# Patient Record
Sex: Female | Born: 1985
Health system: Southern US, Community
[De-identification: ages and names within clinical notes are randomized; demographics above are authoritative.]

## PROBLEM LIST (undated history)

## (undated) DIAGNOSIS — Z789 Other specified health status: Secondary | ICD-10-CM

## (undated) HISTORY — PX: FOOT TENDON SURGERY: SHX958

---

## 2014-01-03 LAB — EKG 12-LEAD
Atrial Rate: 58 {beats}/min
P Axis: 65 degrees
P-R Interval: 170 ms
Q-T Interval: 430 ms
QRS Duration: 98 ms
QTc Calculation (Bazett): 422 ms
R Axis: 70 degrees
T Axis: 29 degrees
Ventricular Rate: 58 {beats}/min

## 2014-01-25 NOTE — Progress Notes (Signed)
YEARLY PHYSICAL    Date of service: 01/25/2014    Kelli CalkinsLaura Wallace  Is a 28 y.o.  married female    PT's PCP is: No primary provider on file.     DOB: [birth year]                                             Subjective:       Patient's last menstrual period was 01/04/2014.     Are your menses regular: yes    OB History   No data available        History   Smoking status   ??? Never Smoker    Smokeless tobacco   ??? Never Used        History   Alcohol Use   ??? 0.0 oz/week   ??? 0 Not specified per week     Comment: very rarely       History reviewed. No pertinent family history.    Allergies: Pcn    Current outpatient prescriptions:PROAIR HFA 108 (90 BASE) MCG/ACT inhaler, , Disp: , Rfl: 1    History   Sexual Activity   ??? Sexual Activity:   ??? Partners: Male       Any bleeding or pain with intercourse: No    Last Yearly:  2013    Last pap: 2013    Last HPV: never    Last Mammogram: never    Last Dexascan never    Do you do self breast exams: Yes    No past medical history on file.    No past surgical history on file.    History reviewed. No pertinent family history.    Chief Complaint   Patient presents with   ??? Gynecologic Exam     yearly wellness exam          PE:  Vital Signs  Blood pressure 102/64, pulse 62, resp. rate 18, height 5\' 10"  (1.778 m), weight 166 lb (75.297 kg), last menstrual period 01/04/2014, not currently breastfeeding.     Labs: Declines Hgb had recent labs drawn    No results found for this visit on 01/25/14.      NURSE: Kelli A Ammanniti LPN    HPI: i am doing well.    Yes PT denies fever, chills, nausea and vomiting       Objective  Lymphatic:   no lymphadenopathy  Heent:   negative   Cor: regular rate and rhythm, no murmurs              ZOX:WRUEAPul:clear to auscultation bilaterally- no wheezes, rales or rhonchi, normal air movement, no respiratory distress      GI: Abdomen soft, non-tender. BS normal. No masses,  No organomegaly            Extremities: normal strength, tone, and muscle mass   Breasts: Breast:normal appearance, no masses or tenderness   Pelvic Exam: GENITAL/URINARY:  External Genitalia:  General appearance; normal, Hair distribution; normal, Lesions absent  Urethra:  Fullness absent, Masses absent  Bladder:  Fullness absent, Masses absent, Tenderness absent, Cystocele absent  Vagina:  General appearance normal, Estrogen effect normal, Discharge absent, Lesions absent, Pelvic support normal  Cervix:  General appearance normal, Lesions absent, Discharge absent, Tenderness absent, Enlargement absent, Nodularity absent  Uterus:  Size normal, Contour normal  Adenexa:  Masses absent, Tenderness absent  Vaginal discharge: no vaginal discharge     Over 50% of time spent on counseling and care coordination on: see assessment and plan   She was also counseled on her preventative health maintenance recommendations and follow-up.                        Assessment and Plan         1. Encounter for gynecological examination without abnormal finding  PAP SMEAR    Chlamydia/GC DNA, Thin Prep    POCT Urinalysis no Micro   2. Screen for STD (sexually transmitted disease)  Chlamydia/GC DNA, Thin Prep       I am having [name] maintain her PROAIR HFA.

## 2014-01-27 LAB — C.TRACHOMATIS N.GONORRHOEAE DNA, THIN PREP
Chlamydia By Thin Prep: NEGATIVE
N. gonorrhoeae DNA, Thin Prep: NEGATIVE

## 2014-02-08 LAB — GYN CYTOLOGY

## 2014-02-09 NOTE — Progress Notes (Signed)
Quick Note:    Note sent for normal pap  ______

## 2015-07-29 ENCOUNTER — Emergency Department: Payer: Worker's Compensation

## 2015-07-29 ENCOUNTER — Emergency Department
Admission: EM | Admit: 2015-07-29 | Discharge: 2015-07-29 | Disposition: A | Discharge status: Home / Self Care | Payer: Worker's Compensation | Attending: Emergency Medicine | Admitting: Emergency Medicine

## 2015-07-29 DIAGNOSIS — Y9389 Activity, other specified: Secondary | ICD-10-CM | POA: Diagnosis not present

## 2015-07-29 DIAGNOSIS — Y999 Unspecified external cause status: Secondary | ICD-10-CM | POA: Insufficient documentation

## 2015-07-29 DIAGNOSIS — Y929 Unspecified place or not applicable: Secondary | ICD-10-CM | POA: Insufficient documentation

## 2015-07-29 DIAGNOSIS — G8911 Acute pain due to trauma: Secondary | ICD-10-CM

## 2015-07-29 DIAGNOSIS — S0181XA Laceration without foreign body of other part of head, initial encounter: Secondary | ICD-10-CM | POA: Insufficient documentation

## 2015-07-29 DIAGNOSIS — S0083XA Contusion of other part of head, initial encounter: Secondary | ICD-10-CM

## 2015-07-29 DIAGNOSIS — W208XXA Other cause of strike by thrown, projected or falling object, initial encounter: Secondary | ICD-10-CM | POA: Insufficient documentation

## 2015-07-29 MED ORDER — CEPHALEXIN 500 MG PO CAPS: 500.0000 mg | ORAL_CAPSULE | Freq: Three times a day (TID) | ORAL | Status: DC

## 2015-07-29 MED ORDER — LIDOCAINE HCL (PF) 1 % IJ SOLN
5.0000 mL | Freq: Once | INTRAMUSCULAR | Status: DC
  Filled 2015-07-29: qty 5

## 2015-07-29 NOTE — ED Notes (Signed)
Laceration approx 1 inch under left eye. PT states that this occurred this AM from a "hammer" getting stuck in net while practicing shotput. Pt denies vision changes. Pt alert and oriented X4, active, cooperative, pt in NAD. RR even and unlabored, color WNL.  No bleeding at this time.

## 2015-07-29 NOTE — Discharge Instructions (Signed)
Cryotherapy Cryotherapy is when you put ice on your injury. Ice helps lessen pain and puffiness (swelling) after an injury. Ice works the best when you start using it in the first 24 to 48 hours after an injury. HOME CARE  Put a dry or damp towel between the ice pack and your skin.  You may press gently on the ice pack.  Leave the ice on for no more than 10 to 20 minutes at a time.  Check your skin after 5 minutes to make sure your skin is okay.  Rest at least 20 minutes between ice pack uses.  Stop using ice when your skin loses feeling (numbness).  Do not use ice on someone who cannot tell you when it hurts. This includes small children and people with memory problems (dementia). GET HELP RIGHT AWAY IF:  You have white spots on your skin.  Your skin turns blue or pale.  Your skin feels waxy or hard.  Your puffiness gets worse. MAKE SURE YOU:   Understand these instructions.  Will watch your condition.  Will get help right away if you are not doing well or get worse.   This information is not intended to replace advice given to you by your health care provider. Make sure you discuss any questions you have with your health care provider.   Document Released: 09/11/2007 Document Revised: 06/17/2011 Document Reviewed: 11/15/2010 Elsevier Interactive Patient Education 2016 [employer]  Facial Laceration A facial laceration is a cut on the face. These injuries can be painful and cause bleeding. Some cuts may need to be closed with stitches (sutures), skin adhesive strips, or wound glue. Cuts usually heal quickly but can leave a scar. It can take 1-2 years for the scar to go away completely. HOME CARE   Only take medicines as told by your doctor.  Follow your doctor's instructions for wound care. For Stitches:  Keep the cut clean and dry.  If you have a bandage (dressing), change it at least once a day. Change the bandage if it gets wet or dirty, or as told by your  doctor.  Wash the cut with soap and water 2 times a day. Rinse the cut with water. Pat it dry with a clean towel.  Put a thin layer of medicated cream on the cut as told by your doctor.  You may shower after the first 24 hours. Do not soak the cut in water until the stitches are removed.  Have your stitches removed as told by your doctor.  Do not wear any makeup until a few days after your stitches are removed. For Skin Adhesive Strips:  Keep the cut clean and dry.  Do not get the strips wet. You may take a bath, but be careful to keep the cut dry.  If the cut gets wet, pat it dry with a clean towel.  The strips will fall off on their own. Do not remove the strips that are still stuck to the cut. For Wound Glue:  You may shower or take baths. Do not soak or scrub the cut. Do not swim. Avoid heavy sweating until the glue falls off on its own. After a shower or bath, pat the cut dry with a clean towel.  Do not put medicine or makeup on your cut until the glue falls off.  If you have a bandage, do not put tape over the glue.  Avoid lots of sunlight or tanning lamps until the glue falls off.  The glue will fall off on its own in 5-10 days. Do not pick at the glue. After Healing:  Put sunscreen on the cut for the first year to reduce your scar. GET HELP IF:  You have a fever. GET HELP RIGHT AWAY IF:   Your cut area gets red, painful, or puffy (swollen).  You see a yellowish-white fluid (pus) coming from the cut.   This information is not intended to replace advice given to you by your health care provider. Make sure you discuss any questions you have with your health care provider.   Document Released: 09/11/2007 Document Revised: 04/15/2014 Document Reviewed: 11/05/2012 Elsevier Interactive Patient Education 2016 Elsevier Inc.  Facial or Scalp Contusion  A facial or scalp contusion is a deep bruise on the face or head. Contusions happen when an injury causes bleeding  under the skin. Signs of bruising include pain, puffiness (swelling), and discolored skin. The contusion may turn blue, purple, or yellow. HOME CARE  Only take medicines as told by your doctor.  Put ice on the injured area.  Put ice in a plastic bag.  Place a towel between your skin and the bag.  Leave the ice on for 20 minutes, 2-3 times a day. GET HELP IF:  You have bite problems.  You have pain when chewing.  You are worried about your face not healing normally. GET HELP RIGHT AWAY IF:   You have severe pain or a headache and medicine does not help.  You are very tired or confused, or your personality changes.  You throw up (vomit).  You have a nosebleed that will not stop.  You see two of everything (double vision) or have blurry vision.  You have fluid coming from your nose or ear.  You have problems walking or using your arms or legs. MAKE SURE YOU:   Understand these instructions.  Will watch your condition.  Will get help right away if you are not doing well or get worse.   This information is not intended to replace advice given to you by your health care provider. Make sure you discuss any questions you have with your health care provider.   Document Released: 03/14/2011 Document Revised: 04/15/2014 Document Reviewed: 11/05/2012 Elsevier Interactive Patient Education [employer]

## 2015-07-29 NOTE — ED Provider Notes (Signed)
[facility] Emergency Department Provider Note  ____________________________________________  Time seen: Approximately 11:19 AM  I have reviewed the triage vital signs and the nursing notes.   HISTORY  Chief Complaint Facial Laceration    HPI Joy Lawson is a 30 y.o. female , NAD, presents to the emergency department with complaint of laceration under the left eye that occurred today. States she is a [employer] and was getting a stuck shotput hammer released from noting that was 10 feet in the air. Was on a ladder at the time. States she was able to release the hammer but unfortunately it fell and struck her below the left eye. She did not fall from the ladder and denies any dizziness, headache, visual changes. Notes that she was able to and from the ladder without assistance to tend to the laceration. She is accompanied by her husband who states the patient has been walking and talking per her usual. Has not noted any changes in her demeanor. Tetanus is up-to-date. Has not taken anything for pain and denies any pain at this time.   History reviewed. No pertinent past medical history.  There are no active problems to display for this patient.   History reviewed. No pertinent past surgical history.  Current Outpatient Rx  Name  Route  Sig  Dispense  Refill  . cephALEXin (KEFLEX) 500 MG capsule   Oral   Take 1 capsule (500 mg total) by mouth 3 (three) times daily.   21 capsule   0     Allergies Review of patient's allergies indicates no known allergies.  No family history on file.  Social History Social History  Substance Use Topics  . Smoking status: Never Smoker   . Smokeless tobacco: None  . Alcohol Use: No     Review of Systems  Constitutional: No fever/chills, fatigue Eyes: No visual changes. No discharge, redness, swelling Cardiovascular: No chest pain. Respiratory:  No shortness of breath.  Gastrointestinal: No abdominal pain.   No nausea, vomiting.   Musculoskeletal: Negative for neck pain.  Skin: Positive laceration below left eye. Positive bruising below left eye. Negative for rash, skin sores. Neurological: Negative for headaches, focal weakness or numbness. No LOC, dizziness. 10-point ROS otherwise negative.  ____________________________________________   PHYSICAL EXAM:  VITAL SIGNS: ED Triage Vitals  Enc Vitals Group     BP 07/29/15 1054 131/90 mmHg     Pulse Rate 07/29/15 1054 71     Resp --      Temp 07/29/15 1054 98.2 F (36.8 C)     Temp Source 07/29/15 1054 Oral     SpO2 07/29/15 1054 99 %     Weight 07/29/15 1054 155 lb (70.308 kg)     Height 07/29/15 1054 5\' 9"  (1.753 m)     Head Cir --      Peak Flow --      Pain Score 07/29/15 1058 2     Pain Loc --      Pain Edu? --      Excl. in GC? --      Constitutional: Alert and oriented. Well appearing and in no acute distress. Eyes: Conjunctivae are normal. PERRL. EOMI without pain.  Head: Atraumatic. Neck: Supple with full range of motion. Hematological/Lymphatic/Immunilogical: No cervical lymphadenopathy. Cardiovascular:  Good peripheral circulation. Respiratory: Normal respiratory effort without tachypnea or retractions.  Musculoskeletal: No lower extremity tenderness nor edema.  No joint effusions. Neurologic:  Normal speech and language. No gross focal neurologic  deficits are appreciated.  Skin:  Skin is warm, dry and intact. No rash noted. Psychiatric: Mood and affect are normal. Speech and behavior are normal. Patient exhibits appropriate insight and judgement.   ____________________________________________   LABS  None ____________________________________________  EKG  None ____________________________________________  RADIOLOGY I have personally viewed and evaluated these images (plain radiographs) as part of my medical decision making, as well as reviewing the written report by the radiologist.  Dg Facial Bones 1-2  Views  07/29/2015  CLINICAL DATA:  Laceration approx 1 inch under left eye and around the left orbit. PT states that this occurred this AM from a "hammer" getting stuck in net while practicing shotput. Best images obtained. Shielded. EXAM: FACIAL BONES - 1-2 VIEW COMPARISON:  None. FINDINGS: There is no evidence of fracture or other significant bone abnormality. No orbital emphysema or sinus air-fluid levels are seen. IMPRESSION: Negative. Electronically Signed   By: Corlis Leak M.D.   On: 07/29/2015 12:01    ____________________________________________    PROCEDURES  Procedure(s) performed: LACERATION REPAIR Performed by: Hope Pigeon Authorized by: Hope Pigeon Consent: Verbal consent obtained. Risks and benefits: risks, benefits and alternatives were discussed Consent given by: patient Patient identity confirmed: provided demographic data Prepped and Draped in normal sterile fashion Wound explored  Laceration Location: Below left eye  Laceration Length: 1.2cm  No Foreign Bodies seen or palpated  Anesthesia: local infiltration  Local anesthetic: lidocaine 1% w/o epinephrine  Anesthetic total: 1 ml  Irrigation method: syringe Amount of cleaning: standard  Skin closure: 6-0 Ethilon  Number of sutures: 3   Technique: Simple, interrupted  Patient tolerance: Patient tolerated the procedure well with no immediate complications.      Medications  lidocaine (PF) (XYLOCAINE) 1 % injection 5 mL (not administered)     ____________________________________________   INITIAL IMPRESSION / ASSESSMENT AND PLAN / ED COURSE  Pertinent imaging results that were available during my care of the patient were reviewed by me and considered in my medical decision making (see chart for details).  Patient's diagnosis is consistent with  laceration to face and contusion of face. Patient will be discharged home with prescriptions forKeflex to take as directed. May take Tylenol as  needed for pain. Advise applying ice to the affected area 20 minutes 3-4 times daily to decrease bruising and swelling. Patient is to follow up with [facility]  in 5 days for suture removal.  Patient is given ED precautions to return to the ED for any worsening or new symptoms.    ____________________________________________  FINAL CLINICAL IMPRESSION(S) / ED DIAGNOSES  Final diagnoses:  Acute pain due to injury  Laceration of face, initial encounter  Contusion of face, initial encounter      NEW MEDICATIONS STARTED DURING THIS VISIT:  Discharge Medication List as of 07/29/2015 12:46 PM    START taking these medications   Details  cephALEXin (KEFLEX) 500 MG capsule Take 1 capsule (500 mg total) by mouth 3 (three) times daily., Starting 07/29/2015, Until Discontinued, Print             Hope Pigeon, PA-C 07/29/15 1402  Emily Filbert, MD 07/29/15 [phone]

## 2016-03-08 ENCOUNTER — Ambulatory Visit
Admission: RE | Admit: 2016-03-08 | Discharge: 2016-03-08 | Disposition: A | Discharge status: Home / Self Care | Payer: BLUE CROSS/BLUE SHIELD | Source: Ambulatory Visit | Attending: Family Medicine | Admitting: Family Medicine

## 2016-03-08 ENCOUNTER — Other Ambulatory Visit: Payer: Self-pay | Admitting: Family Medicine

## 2016-03-08 DIAGNOSIS — M549 Dorsalgia, unspecified: Secondary | ICD-10-CM | POA: Diagnosis not present

## 2016-03-08 DIAGNOSIS — M4316 Spondylolisthesis, lumbar region: Secondary | ICD-10-CM | POA: Diagnosis not present

## 2016-03-14 ENCOUNTER — Ambulatory Visit (INDEPENDENT_AMBULATORY_CARE_PROVIDER_SITE_OTHER): Payer: BLUE CROSS/BLUE SHIELD | Admitting: Family Medicine

## 2016-03-14 VITALS — BP 126/92

## 2016-03-14 DIAGNOSIS — M4316 Spondylolisthesis, lumbar region: Secondary | ICD-10-CM

## 2016-03-19 NOTE — Progress Notes (Signed)
Patient presents today for follow-up regarding lower back symptoms. X-rays did show grade 1-2 spondylolisthesis L4-L5. This could be contributing to the symptoms she is describing. She denied having any acute trauma or injury recently. Currently she has no pain. She states that she was more concerned at how the lower back felt/looked. She states that she does still do throwing events with track and field. She admits to lifting in the weight room however does not describe any difficulty or pain doing this. She denies doing any extension type activity related to throwing or in the weight room.   ROS: Negative except mentioned above. Vitals as per Epic.  GENERAL: NAD No exam performed today.  A/P: Lumbar spondylolisthesis - does not appear to be acute in nature, patient does not want to do any further imaging such as MRI or CT, discussed with patient modifications she should do related to her throwing and weight room activities, would recommend seeing a spine specialist if patient has any feeling of instability, radicular symptoms or pain. At this point she denies any. Encouraged core strength and hamstring flexibility. Patient appreciative of care.

## 2016-07-15 ENCOUNTER — Ambulatory Visit (INDEPENDENT_AMBULATORY_CARE_PROVIDER_SITE_OTHER): Payer: BLUE CROSS/BLUE SHIELD | Admitting: Family Medicine

## 2016-07-15 ENCOUNTER — Encounter: Payer: Self-pay | Admitting: Family Medicine

## 2016-07-15 VITALS — BP 117/80 | HR 74 | Temp 98.4°F | Resp 14 | Wt 168.2 lb

## 2016-07-15 DIAGNOSIS — J029 Acute pharyngitis, unspecified: Secondary | ICD-10-CM

## 2016-07-15 DIAGNOSIS — R509 Fever, unspecified: Secondary | ICD-10-CM | POA: Diagnosis not present

## 2016-07-15 DIAGNOSIS — R5383 Other fatigue: Secondary | ICD-10-CM

## 2016-07-15 NOTE — Progress Notes (Signed)
Patient presents today with symptoms of fatigue. Patient states that she had a fever, myalgias, sore throat, nasal congestion which started out on March 25th. She states that those symptoms now have resolved. She has also experienced a decreased appetite. She denies any significant weight loss. She denies any international travel recently. She denies any tick bites. 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 She is unsure of her EBV status. Her menstrual cycles are normal and she has not skipped a cycle. She denies any new medications or supplements.  ROS: Negative except mentioned above. Vitals as per Epic. GENERAL: NAD HEENT: no pharyngeal erythema, no exudate, no erythema of TMs, no cervical LAD RESP: CTA B CARD: RRR ABD: Positive bowel sounds, nontender, no organomegaly appreciated, no flank tenderness NEURO: CN II-XII grossly intact   A/P: Fatigue, URI symptoms that have resolved - will do some basic lab work including EBV titers, rest, hydration, monitor for any further symptoms, seek medical attention if symptoms persist or worsen as discussed.

## 2016-07-16 LAB — CBC WITH DIFFERENTIAL/PLATELET
BASOS ABS: 0.1 10*3/uL (ref 0.0–0.2)
Basos: 1 %
EOS (ABSOLUTE): 0.1 10*3/uL (ref 0.0–0.4)
Eos: 2 %
Hematocrit: 41.9 % (ref 34.0–46.6)
Hemoglobin: 14.6 g/dL (ref 11.1–15.9)
Immature Grans (Abs): 0 10*3/uL (ref 0.0–0.1)
Immature Granulocytes: 0 %
LYMPHS ABS: 2.2 10*3/uL (ref 0.7–3.1)
LYMPHS: 27 %
MCH: 28.7 pg (ref 26.6–33.0)
MCHC: 34.8 g/dL (ref 31.5–35.7)
MCV: 83 fL (ref 79–97)
Monocytes Absolute: 0.5 10*3/uL (ref 0.1–0.9)
Monocytes: 7 %
NEUTROS ABS: 5.2 10*3/uL (ref 1.4–7.0)
Neutrophils: 63 %
PLATELETS: 336 10*3/uL (ref 150–379)
RBC: 5.08 x10E6/uL (ref 3.77–5.28)
RDW: 13.7 % (ref 12.3–15.4)
WBC: 8 10*3/uL (ref 3.4–10.8)

## 2016-07-16 LAB — COMPREHENSIVE METABOLIC PANEL
ALT: 26 IU/L (ref 0–32)
AST: 24 IU/L (ref 0–40)
Albumin/Globulin Ratio: 2 (ref 1.2–2.2)
Albumin: 4.3 g/dL (ref 3.5–5.5)
Alkaline Phosphatase: 82 IU/L (ref 39–117)
BUN/Creatinine Ratio: 14 (ref 9–23)
BUN: 13 mg/dL (ref 6–20)
Bilirubin Total: 0.3 mg/dL (ref 0.0–1.2)
CALCIUM: 9.5 mg/dL (ref 8.7–10.2)
CO2: 19 mmol/L (ref 18–29)
CREATININE: 0.9 mg/dL (ref 0.57–1.00)
Chloride: 107 mmol/L — ABNORMAL HIGH (ref 96–106)
GFR calc Af Amer: 99 mL/min/{1.73_m2} (ref >59)
GFR, EST NON AFRICAN AMERICAN: 86 mL/min/{1.73_m2} (ref >59)
GLOBULIN, TOTAL: 2.1 g/dL (ref 1.5–4.5)
Glucose: 112 mg/dL — ABNORMAL HIGH (ref 65–99)
Potassium: 4.3 mmol/L (ref 3.5–5.2)
SODIUM: 142 mmol/L (ref 134–144)
TOTAL PROTEIN: 6.4 g/dL (ref 6.0–8.5)

## 2016-07-16 LAB — EPSTEIN-BARR VIRUS VCA ANTIBODY PANEL
EBV NA IgG: 415 U/mL — ABNORMAL HIGH (ref 0.0–17.9)
EBV VCA IgG: >600 U/mL — ABNORMAL HIGH (ref 0.0–17.9)
EBV VCA IgM: <36 U/mL (ref 0.0–35.9)

## 2016-07-16 LAB — TSH: TSH: 0.632 u[IU]/mL (ref 0.450–4.500)

## 2018-04-15 ENCOUNTER — Other Ambulatory Visit: Payer: Self-pay | Admitting: Family Medicine

## 2018-04-16 LAB — COMPREHENSIVE METABOLIC PANEL
ALK PHOS: 84 IU/L (ref 39–117)
ALT: 27 IU/L (ref 0–32)
AST: 22 IU/L (ref 0–40)
Albumin/Globulin Ratio: 2 (ref 1.2–2.2)
Albumin: 4.3 g/dL (ref 3.5–5.5)
BILIRUBIN TOTAL: 0.4 mg/dL (ref 0.0–1.2)
BUN/Creatinine Ratio: 16 (ref 9–23)
BUN: 15 mg/dL (ref 6–20)
CHLORIDE: 104 mmol/L (ref 96–106)
CO2: 21 mmol/L (ref 20–29)
Calcium: 9.5 mg/dL (ref 8.7–10.2)
Creatinine, Ser: 0.96 mg/dL (ref 0.57–1.00)
GFR calc non Af Amer: 79 mL/min/{1.73_m2} (ref >59)
GFR, EST AFRICAN AMERICAN: 91 mL/min/{1.73_m2} (ref >59)
GLUCOSE: 82 mg/dL (ref 65–99)
Globulin, Total: 2.2 g/dL (ref 1.5–4.5)
Potassium: 4.3 mmol/L (ref 3.5–5.2)
Sodium: 139 mmol/L (ref 134–144)
TOTAL PROTEIN: 6.5 g/dL (ref 6.0–8.5)

## 2018-04-16 LAB — CBC WITH DIFFERENTIAL/PLATELET
BASOS ABS: 0 10*3/uL (ref 0.0–0.2)
Basos: 1 %
EOS (ABSOLUTE): 0.5 10*3/uL — AB (ref 0.0–0.4)
Eos: 9 %
Hematocrit: 40.5 % (ref 34.0–46.6)
Hemoglobin: 13.7 g/dL (ref 11.1–15.9)
IMMATURE GRANS (ABS): 0 10*3/uL (ref 0.0–0.1)
Immature Granulocytes: 0 %
LYMPHS: 28 %
Lymphocytes Absolute: 1.5 10*3/uL (ref 0.7–3.1)
MCH: 27.8 pg (ref 26.6–33.0)
MCHC: 33.8 g/dL (ref 31.5–35.7)
MCV: 82 fL (ref 79–97)
Monocytes Absolute: 0.4 10*3/uL (ref 0.1–0.9)
Monocytes: 7 %
NEUTROS ABS: 3 10*3/uL (ref 1.4–7.0)
NEUTROS PCT: 55 %
PLATELETS: 341 10*3/uL (ref 150–450)
RBC: 4.92 x10E6/uL (ref 3.77–5.28)
RDW: 12.4 % (ref 11.7–15.4)
WBC: 5.4 10*3/uL (ref 3.4–10.8)

## 2018-04-16 LAB — TSH: TSH: 0.483 u[IU]/mL (ref 0.450–4.500)

## 2018-04-16 LAB — HGB A1C W/O EAG: HEMOGLOBIN A1C: 5.3 % (ref 4.8–5.6)

## 2018-04-21 ENCOUNTER — Ambulatory Visit: Payer: Self-pay | Admitting: Family Medicine

## 2018-05-10 IMAGING — CR DG LUMBAR SPINE COMPLETE 4+V
1 series · 5 of 5 positions shown · non-contrast
Comparison: None.

CLINICAL DATA: Lump on the lumbar spine for year.

EXAM:
LUMBAR SPINE - COMPLETE 4+ VIEW

[Series 1: dg lumbar spine complete 4 +v · 0.14mm/px · 5 of 5 slices shown]
[im 1/5]
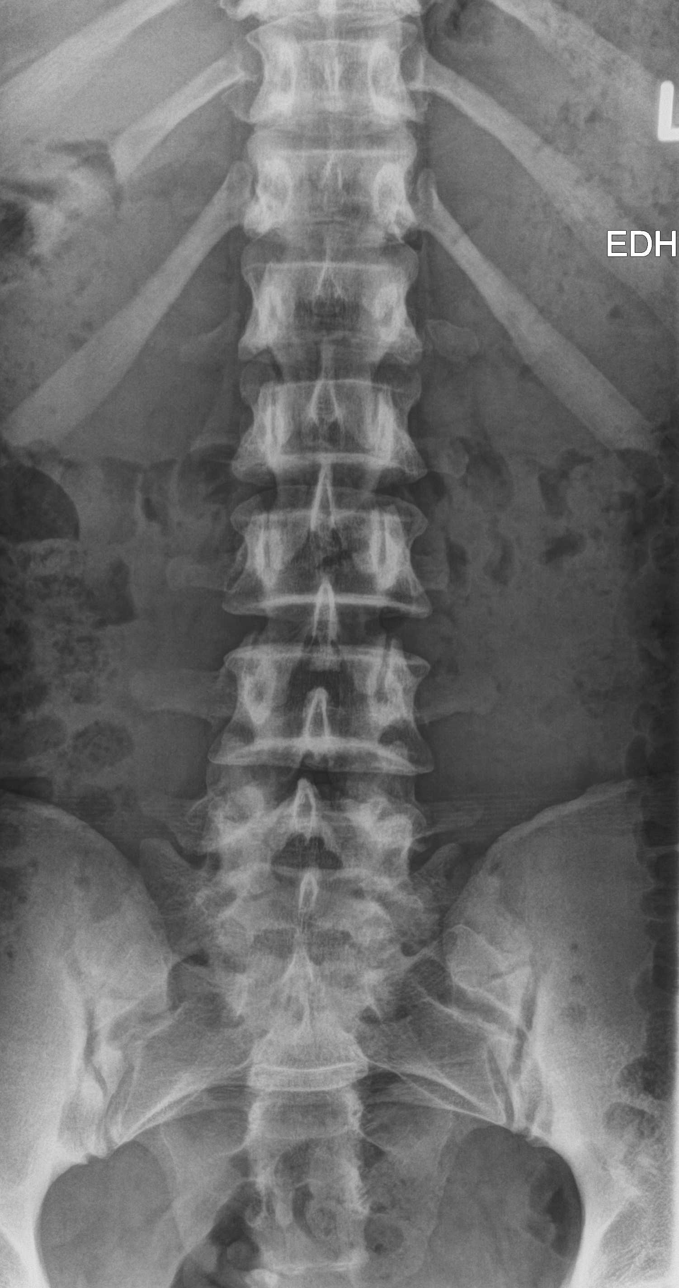
[im 2/5]
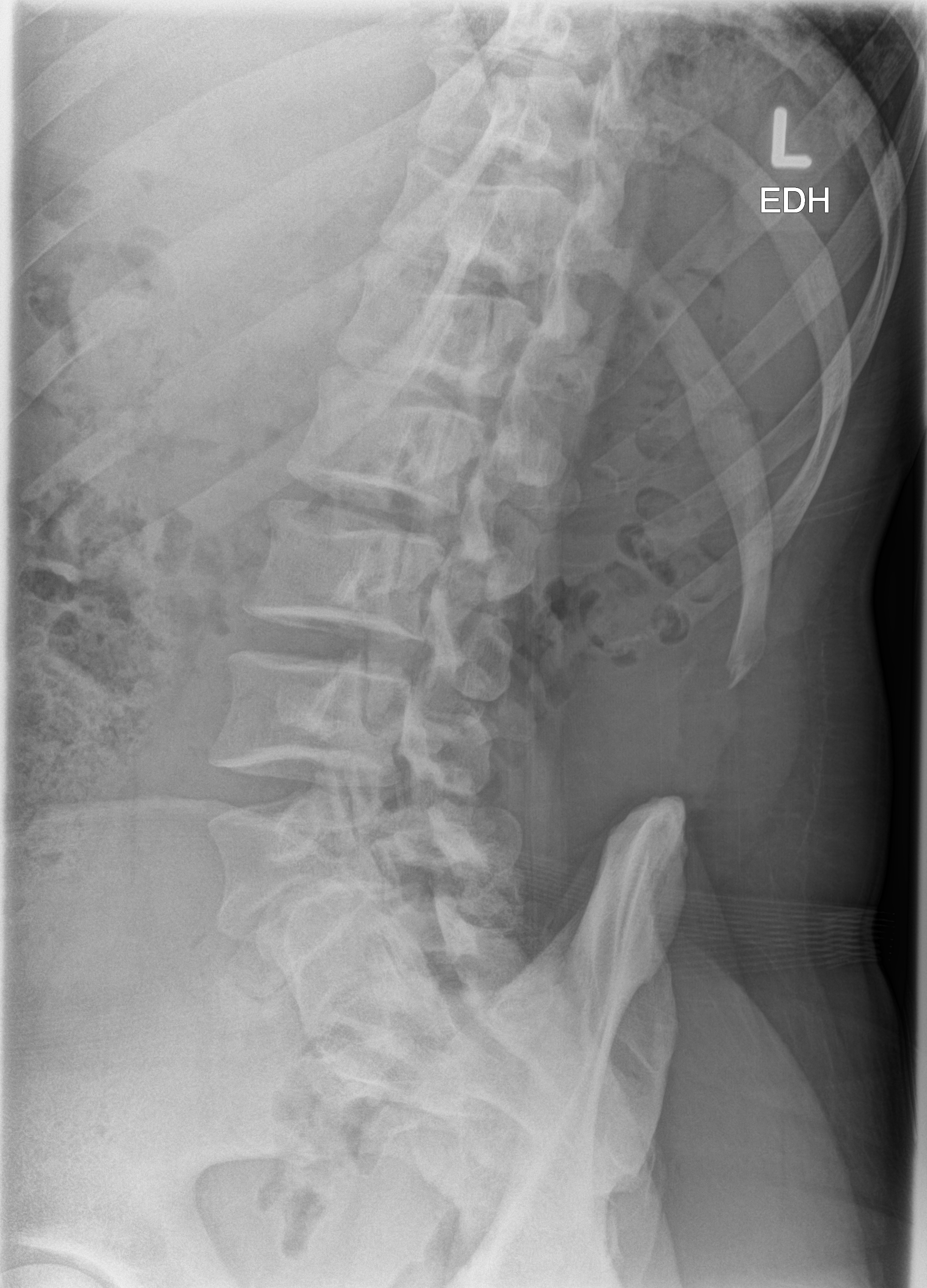
[im 3/5]
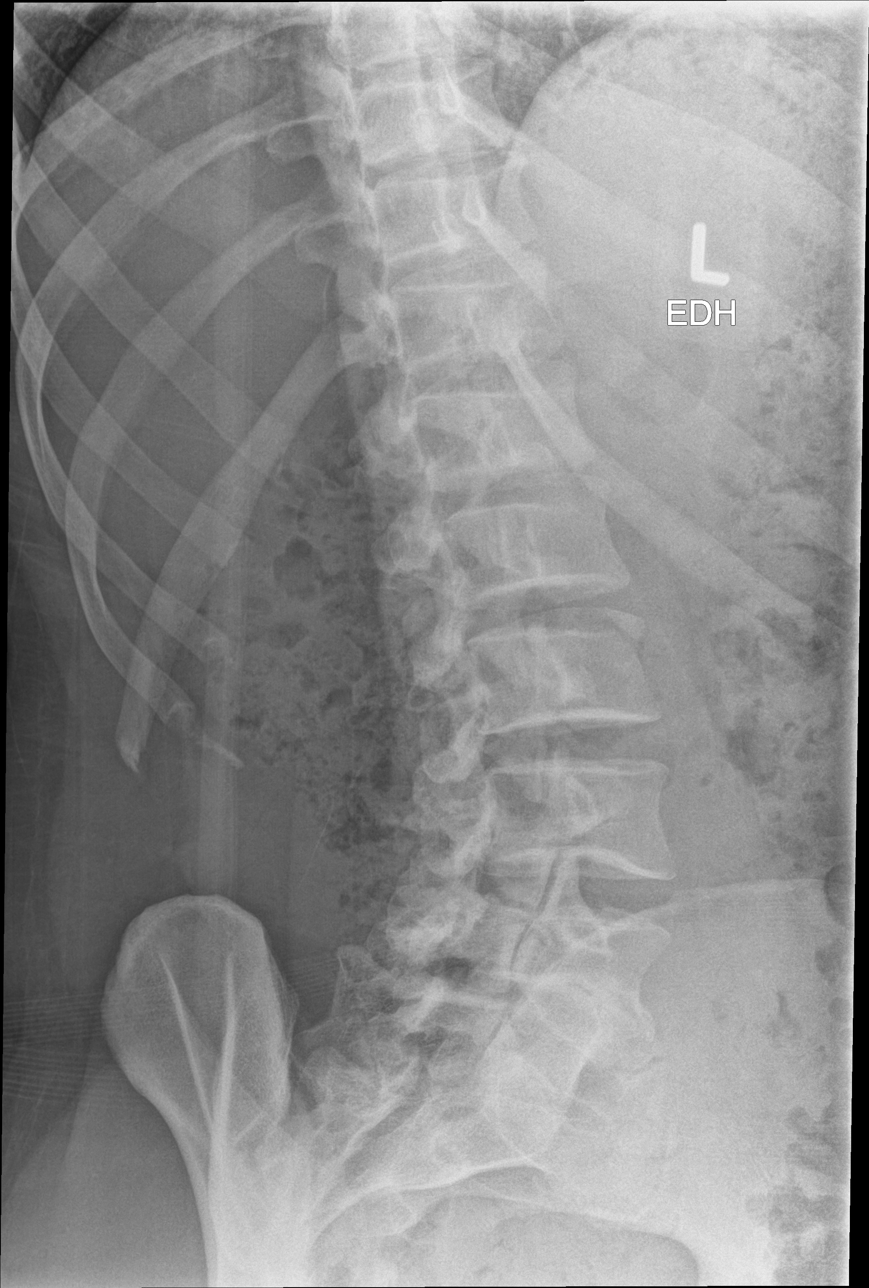
[im 4/5]
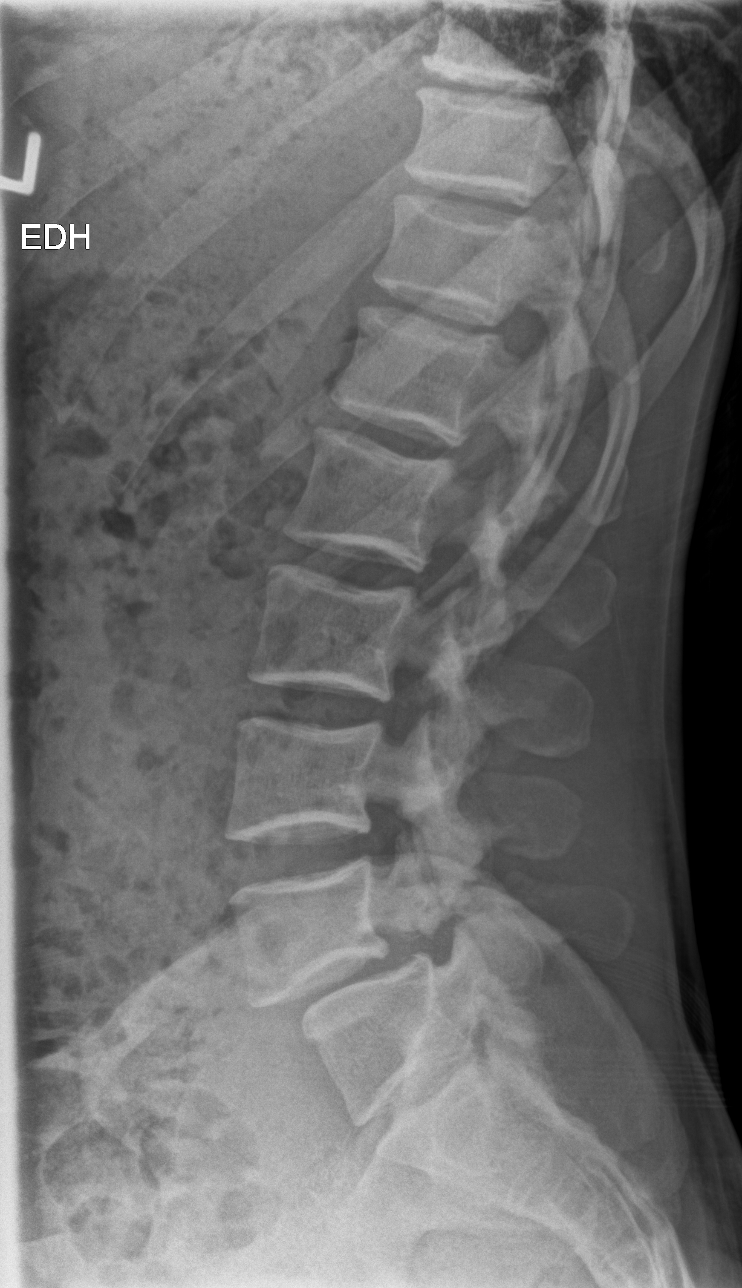
[im 5/5]
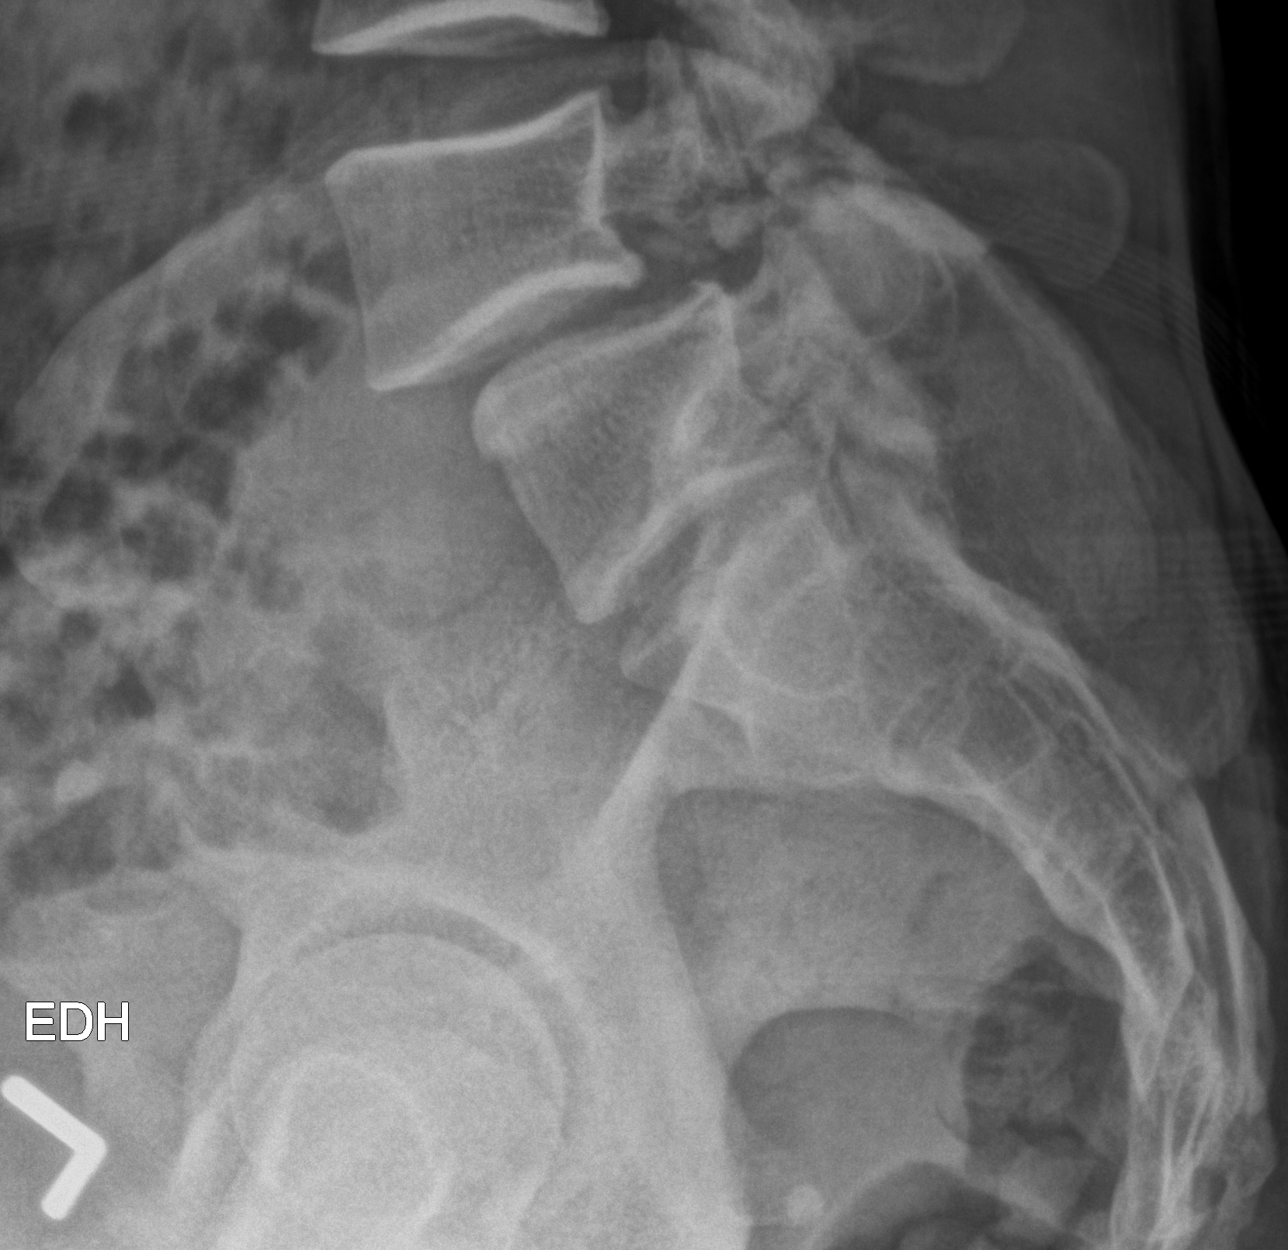

[5 of 5 positions shown; findings below may reference images not displayed]

FINDINGS: There is a transitional lumbosacral anatomy. For numbering purposes
the twelfth ribs are short ribs and lowest square vertebral body on
the lateral will be L5. There is pseudoarticulation of the
transverse process ease of L5 with S1 bilaterally. There
spondylolysis of L4 with 10 mm of anterolisthesis of L4 on L5. No
acute fracture. Slight disc space narrowing at L4-5 and L5-S1. There
is no bone destruction. Soft tissues are unremarkable.
IMPRESSION: Transitional lumbosacral anatomy.

Bilateral L4 spondylolysis with grade 1 bordering on grade 2
spondylolisthesis of L4 on L5.

Pseudoarticulation of the L5 transverse process ease with the sacrum
bilaterally.

## 2018-05-28 ENCOUNTER — Encounter: Payer: Self-pay | Admitting: Physician Assistant

## 2018-05-28 ENCOUNTER — Ambulatory Visit (INDEPENDENT_AMBULATORY_CARE_PROVIDER_SITE_OTHER): Payer: BLUE CROSS/BLUE SHIELD | Admitting: Physician Assistant

## 2018-05-28 ENCOUNTER — Other Ambulatory Visit: Payer: Self-pay

## 2018-05-28 ENCOUNTER — Other Ambulatory Visit (HOSPITAL_COMMUNITY)
Admission: RE | Admit: 2018-05-28 | Discharge: 2018-05-28 | Disposition: A | Discharge status: Home / Self Care | Payer: BLUE CROSS/BLUE SHIELD | Source: Ambulatory Visit | Attending: Physician Assistant | Admitting: Physician Assistant

## 2018-05-28 VITALS — BP 132/84 | HR 76 | Temp 98.3°F | Resp 16 | Ht 69.0 in | Wt 165.6 lb

## 2018-05-28 DIAGNOSIS — Z124 Encounter for screening for malignant neoplasm of cervix: Secondary | ICD-10-CM | POA: Insufficient documentation

## 2018-05-28 DIAGNOSIS — Z0000 Encounter for general adult medical examination without abnormal findings: Secondary | ICD-10-CM

## 2018-05-28 NOTE — Patient Instructions (Signed)

## 2018-05-28 NOTE — Progress Notes (Signed)
Patient: Joy Lawson, Female    DOB: Sep 24, 1985, 33 y.o.   MRN: [id number] Visit Date: 05/28/2018  Today's Provider: Trey Sailors, PA-C   Chief Complaint  Patient presents with  . New Patient (Initial Visit)   Subjective:     Annual physical exam Joy Lawson is a 33 y.o. female who presents today for health maintenance and complete physical. She feels well. She reports exercising daily. She reports she is sleeping well. She coaches Engineer, civil (consulting) at [employer]. She is from [place] and came here 10 years ago for track at [facility]. She currently lives in [place] with her husband of five years. She does not have children. She does not desire birth control. PAP done > 3 years ago.   She has had recent labs with [facility] which are available in Epic.  -----------------------------------------------------------------   Review of Systems  Constitutional: Negative.   HENT: Negative.   Eyes: Negative.   Respiratory: Negative.   Cardiovascular: Negative.   Gastrointestinal: Negative.   Endocrine: Negative.   Genitourinary: Negative.   Musculoskeletal: Negative.   Skin: Negative.   Allergic/Immunologic: Negative.   Neurological: Negative.   Hematological: Negative.   Psychiatric/Behavioral: Negative.     Social History      She  reports that she has never smoked. She has never used smokeless tobacco. She reports that she does not drink alcohol.       Social History   Socioeconomic History  . Marital status: Married    Spouse name: Not on file  . Number of children: Not on file  . Years of education: Not on file  . Highest education level: Not on file  Occupational History  . Not on file  Social Needs  . Financial resource strain: Not on file  . Food insecurity:    Worry: Not on file    Inability: Not on file  . Transportation needs:    Medical: Not on file    Non-medical: Not on file  Tobacco Use  . Smoking status: Never Smoker  . Smokeless tobacco:  Never Used  Substance and Sexual Activity  . Alcohol use: No  . Drug use: Not on file  . Sexual activity: Not on file  Lifestyle  . Physical activity:    Days per week: Not on file    Minutes per session: Not on file  . Stress: Not on file  Relationships  . Social connections:    Talks on phone: Not on file    Gets together: Not on file    Attends religious service: Not on file    Active member of club or organization: Not on file    Attends meetings of clubs or organizations: Not on file    Relationship status: Not on file  Other Topics Concern  . Not on file  Social History Narrative  . Not on file    No past medical history on file.   There are no active problems to display for this patient.   No past surgical history on file.  Family History        Family Status  Relation Name Status  . Mother  Alive  . Father  Alive        Her family history is not on file.      No Known Allergies  No current outpatient medications on file.   Patient Care Team: Maryella Shivers as PCP - General (Physician Assistant)  Objective:    Vitals: BP 132/84 (BP Location: Right Arm, Patient Position: Sitting, Cuff Size: Normal)   Pulse 76   Temp 98.3 F (36.8 C) (Oral)   Resp 16   Ht 5\' 9"  (1.753 m)   Wt 165 lb 9.6 oz (75.1 kg)   LMP 05/15/2018 (Approximate)   BMI 24.45 kg/m    Vitals:   05/28/18 0854  BP: 132/84  Pulse: 76  Resp: 16  Temp: 98.3 F (36.8 C)  TempSrc: Oral  Weight: 165 lb 9.6 oz (75.1 kg)  Height: 5\' 9"  (1.753 m)      Physical Exam Exam conducted with a chaperone present.  Constitutional:      Appearance: Normal appearance.  Cardiovascular:     Rate and Rhythm: Regular rhythm. Tachycardia present.     Heart sounds: Normal heart sounds.  Pulmonary:     Effort: Pulmonary effort is normal.     Breath sounds: Normal breath sounds.  Genitourinary:    Exam position: Lithotomy position.     Labia:        Right: No rash.        Left:  No rash.      Cervix: No cervical motion tenderness or discharge.     Uterus: Normal.      Adnexa: Right adnexa normal and left adnexa normal.  Skin:    General: Skin is warm and dry.  Neurological:     Mental Status: She is alert and oriented to person, place, and time. Mental status is at baseline.  Psychiatric:        Mood and Affect: Mood normal.        Behavior: Behavior normal.      Depression Screen PHQ 2/9 Scores 05/28/2018  PHQ - 2 Score 0  PHQ- 9 Score 0       Assessment & Plan:     Routine Health Maintenance and Physical Exam  Exercise Activities and Dietary recommendations Goals   None      There is no immunization history on file for this patient.  Health Maintenance  Topic Date Due  . HIV Screening  05/18/2001  . TETANUS/TDAP  05/18/2005  . PAP SMEAR-Modifier  05/19/2007  . INFLUENZA VACCINE  11/06/2017     Discussed health benefits of physical activity, and encouraged her to engage in regular exercise appropriate for her age and condition.   1. Annual physical exam   2. Cervical cancer screening  - Cytology - PAP  The entirety of the information documented in the History of Present Illness, Review of Systems and Physical Exam were personally obtained by me. Portions of this information were initially documented by Rondel Baton, CMA and reviewed by me for thoroughness and accuracy.   Return in about 1 year (around 05/29/2019) for CPE.  --------------------------------------------------------------------    Trey Sailors, PA-C  [facility]

## 2018-06-02 LAB — CYTOLOGY - PAP
Adequacy: ABSENT
Diagnosis: NEGATIVE
HPV: NOT DETECTED

## 2019-04-27 ENCOUNTER — Ambulatory Visit: Payer: BC Managed Care – PPO | Attending: Internal Medicine

## 2019-04-27 DIAGNOSIS — U071 COVID-19: Secondary | ICD-10-CM | POA: Insufficient documentation

## 2019-04-27 DIAGNOSIS — Z20822 Contact with and (suspected) exposure to covid-19: Secondary | ICD-10-CM

## 2019-04-28 LAB — NOVEL CORONAVIRUS, NAA: SARS-CoV-2, NAA: DETECTED — AB

## 2019-07-15 NOTE — Progress Notes (Signed)
Patient: Joy Lawson, Female    DOB: May 16, 1985, 34 y.o.   MRN: [id number] Visit Date: 07/16/2019  Today's Provider: Trey Sailors, PA-C   Chief Complaint  Patient presents with  . Annual Exam   Subjective:    I, Joy Lawson,CMA am acting as a Neurosurgeon for [place].   Annual physical exam Joy Lawson is a 33 y.o. female who presents today for health maintenance and complete physical. She feels well. She reports exercising twice day. She reports she is sleeping well.  Last PAP: 05/28/2018 normal and negative for HPV.   Track season at [employer] had been cancelled last year but has most recently been restarted.   Got her Laural Benes and Laural Benes shot on 07/18/2019.  -----------------------------------------------------------------   Review of Systems  Constitutional: Negative.   HENT: Negative.   Eyes: Negative.   Respiratory: Negative.   Cardiovascular: Negative.   Gastrointestinal: Negative.   Endocrine: Negative.   Genitourinary: Negative.   Musculoskeletal: Negative.   Skin: Negative.   Allergic/Immunologic: Negative.   Neurological: Negative.   Hematological: Negative.   Psychiatric/Behavioral: Negative.     Social History      She  reports that she has never smoked. She has never used smokeless tobacco. She reports that she does not drink alcohol.       Social History   Socioeconomic History  . Marital status: Married    Spouse name: Not on file  . Number of children: Not on file  . Years of education: Not on file  . Highest education level: Not on file  Occupational History  . Not on file  Tobacco Use  . Smoking status: Never Smoker  . Smokeless tobacco: Never Used  Substance and Sexual Activity  . Alcohol use: No  . Drug use: Not on file  . Sexual activity: Not on file  Other Topics Concern  . Not on file  Social History Narrative  . Not on file   Social Determinants of Health   Financial Resource Strain:   . Difficulty of  Paying Living Expenses:   Food Insecurity:   . Worried About Programme researcher, broadcasting/film/video in the Last Year:   . Barista in the Last Year:   Transportation Needs:   . Freight forwarder (Medical):   Marland Kitchen Lack of Transportation (Non-Medical):   Physical Activity:   . Days of Exercise per Week:   . Minutes of Exercise per Session:   Stress:   . Feeling of Stress :   Social Connections:   . Frequency of Communication with Friends and Family:   . Frequency of Social Gatherings with Friends and Family:   . Attends Religious Services:   . Active Member of Clubs or Organizations:   . Attends Banker Meetings:   Marland Kitchen Marital Status:     No past medical history on file.   There are no problems to display for this patient.   No past surgical history on file.  Family History        Family Status  Relation Name Status  . Mother  Alive  . Father  Alive        Her family history is not on file.      No Known Allergies  No current outpatient medications on file.   Patient Care Team: Maryella Shivers as PCP - General (Physician Assistant)    Objective:    Vitals: BP 130/88 (BP Location: Right  Arm, Patient Position: Sitting, Cuff Size: Normal)   Pulse 70   Temp (!) 97.3 F (36.3 C) (Temporal)   Ht 5' 9.5" (1.765 m)   Wt 165 lb 6.4 oz (75 kg)   BMI 24.08 kg/m    Vitals:   07/16/19 1103  BP: 130/88  Pulse: 70  Temp: (!) 97.3 F (36.3 C)  TempSrc: Temporal  Weight: 165 lb 6.4 oz (75 kg)  Height: 5' 9.5" (1.765 m)     Physical Exam Constitutional:      Appearance: Normal appearance.  Cardiovascular:     Rate and Rhythm: Normal rate and regular rhythm.     Heart sounds: Normal heart sounds.  Pulmonary:     Effort: Pulmonary effort is normal.     Breath sounds: Normal breath sounds.  Abdominal:     General: Bowel sounds are normal.     Palpations: Abdomen is soft. There is no mass.     Tenderness: There is no abdominal tenderness.  Skin:     General: Skin is warm and dry.  Neurological:     Mental Status: She is alert and oriented to person, place, and time. Mental status is at baseline.  Psychiatric:        Mood and Affect: Mood normal.        Behavior: Behavior normal.      Depression Screen PHQ 2/9 Scores 07/16/2019 05/28/2018  PHQ - 2 Score 0 0  PHQ- 9 Score 0 0       Assessment & Plan:     Routine Health Maintenance and Physical Exam  Exercise Activities and Dietary recommendations Goals   None      There is no immunization history on file for this patient.  Health Maintenance  Topic Date Due  . HIV Screening  Never done  . TETANUS/TDAP  Never done  . INFLUENZA VACCINE  11/07/2019  . PAP SMEAR-Modifier  05/28/2021     Discussed health benefits of physical activity, and encouraged her to engage in regular exercise appropriate for her age and condition.    1. Annual physical exam  - HIV Antibody (routine testing w rflx) - TSH - Lipid panel - Comprehensive metabolic panel - CBC with Differential/Platelet  2. Need for tetanus booster  - Tdap vaccine greater than or equal to 7yo IM  The entirety of the information documented in the History of Present Illness, Review of Systems and Physical Exam were personally obtained by me. Portions of this information were initially documented by [facility] and reviewed by me for thoroughness and accuracy.   F/u 1 year CPE --------------------------------------------------------------------    Trinna Post, PA-C  [facility]

## 2019-07-16 ENCOUNTER — Ambulatory Visit (INDEPENDENT_AMBULATORY_CARE_PROVIDER_SITE_OTHER): Payer: BC Managed Care – PPO | Admitting: Physician Assistant

## 2019-07-16 ENCOUNTER — Encounter: Payer: Self-pay | Admitting: Physician Assistant

## 2019-07-16 ENCOUNTER — Other Ambulatory Visit: Payer: Self-pay

## 2019-07-16 VITALS — BP 130/88 | HR 70 | Temp 97.3°F | Ht 69.5 in | Wt 165.4 lb

## 2019-07-16 DIAGNOSIS — Z23 Encounter for immunization: Secondary | ICD-10-CM | POA: Diagnosis not present

## 2019-07-16 DIAGNOSIS — Z0000 Encounter for general adult medical examination without abnormal findings: Secondary | ICD-10-CM | POA: Diagnosis not present

## 2019-07-16 NOTE — Patient Instructions (Signed)
Health Maintenance, Female Adopting a healthy lifestyle and getting preventive care are important in promoting health and wellness. Ask your health care provider about:  The right schedule for you to have regular tests and exams.  Things you can do on your own to prevent diseases and keep yourself healthy. What should I know about diet, weight, and exercise? Eat a healthy diet   Eat a diet that includes plenty of vegetables, fruits, low-fat dairy products, and lean protein.  Do not eat a lot of foods that are high in solid fats, added sugars, or sodium. Maintain a healthy weight Body mass index (BMI) is used to identify weight problems. It estimates body fat based on height and weight. Your health care provider can help determine your BMI and help you achieve or maintain a healthy weight. Get regular exercise Get regular exercise. This is one of the most important things you can do for your health. Most adults should:  Exercise for at least 150 minutes each week. The exercise should increase your heart rate and make you sweat (moderate-intensity exercise).  Do strengthening exercises at least twice a week. This is in addition to the moderate-intensity exercise.  Spend less time sitting. Even light physical activity can be beneficial. Watch cholesterol and blood lipids Have your blood tested for lipids and cholesterol at 33 years of age, then have this test every 5 years. Have your cholesterol levels checked more often if:  Your lipid or cholesterol levels are high.  You are older than 33 years of age.  You are at high risk for heart disease. What should I know about cancer screening? Depending on your health history and family history, you may need to have cancer screening at various ages. This may include screening for:  Breast cancer.  Cervical cancer.  Colorectal cancer.  Skin cancer.  Lung cancer. What should I know about heart disease, diabetes, and high blood  pressure? Blood pressure and heart disease  High blood pressure causes heart disease and increases the risk of stroke. This is more likely to develop in people who have high blood pressure readings, are of African descent, or are overweight.  Have your blood pressure checked: ? Every 3-5 years if you are 18-39 years of age. ? Every year if you are 40 years old or older. Diabetes Have regular diabetes screenings. This checks your fasting blood sugar level. Have the screening done:  Once every three years after age 40 if you are at a normal weight and have a low risk for diabetes.  More often and at a younger age if you are overweight or have a high risk for diabetes. What should I know about preventing infection? Hepatitis B If you have a higher risk for hepatitis B, you should be screened for this virus. Talk with your health care provider to find out if you are at risk for hepatitis B infection. Hepatitis C Testing is recommended for:  Everyone born from 1945 through 1965.  Anyone with known risk factors for hepatitis C. Sexually transmitted infections (STIs)  Get screened for STIs, including gonorrhea and chlamydia, if: ? You are sexually active and are younger than 33 years of age. ? You are older than 33 years of age and your health care provider tells you that you are at risk for this type of infection. ? Your sexual activity has changed since you were last screened, and you are at increased risk for chlamydia or gonorrhea. Ask your health care provider if   you are at risk.  Ask your health care provider about whether you are at high risk for HIV. Your health care provider may recommend a prescription medicine to help prevent HIV infection. If you choose to take medicine to prevent HIV, you should first get tested for HIV. You should then be tested every 3 months for as long as you are taking the medicine. Pregnancy  If you are about to stop having your period (premenopausal) and  you may become pregnant, seek counseling before you get pregnant.  Take 400 to 800 micrograms (mcg) of folic acid every day if you become pregnant.  Ask for birth control (contraception) if you want to prevent pregnancy. Osteoporosis and menopause Osteoporosis is a disease in which the bones lose minerals and strength with aging. This can result in bone fractures. If you are 65 years old or older, or if you are at risk for osteoporosis and fractures, ask your health care provider if you should:  Be screened for bone loss.  Take a calcium or vitamin D supplement to lower your risk of fractures.  Be given hormone replacement therapy (HRT) to treat symptoms of menopause. Follow these instructions at home: Lifestyle  Do not use any products that contain nicotine or tobacco, such as cigarettes, e-cigarettes, and chewing tobacco. If you need help quitting, ask your health care provider.  Do not use street drugs.  Do not share needles.  Ask your health care provider for help if you need support or information about quitting drugs. Alcohol use  Do not drink alcohol if: ? Your health care provider tells you not to drink. ? You are pregnant, may be pregnant, or are planning to become pregnant.  If you drink alcohol: ? Limit how much you use to 0-1 drink a day. ? Limit intake if you are breastfeeding.  Be aware of how much alcohol is in your drink. In the [place] one drink equals one 12 oz bottle of beer (355 mL), one 5 oz glass of wine (148 mL), or one 1 oz glass of hard liquor (44 mL). General instructions  Schedule regular health, dental, and eye exams.  Stay current with your vaccines.  Tell your health care provider if: ? You often feel depressed. ? You have ever been abused or do not feel safe at home. Summary  Adopting a healthy lifestyle and getting preventive care are important in promoting health and wellness.  Follow your health care provider's instructions about healthy  diet, exercising, and getting tested or screened for diseases.  Follow your health care provider's instructions on monitoring your cholesterol and blood pressure. This information is not intended to replace advice given to you by your health care provider. Make sure you discuss any questions you have with your health care provider. Document Revised: 03/18/2018 Document Reviewed: 03/18/2018 Elsevier Patient Education  2020 Elsevier Inc.  

## 2019-07-17 LAB — LIPID PANEL
Chol/HDL Ratio: 2.4 ratio (ref 0.0–4.4)
Cholesterol, Total: 168 mg/dL (ref 100–199)
HDL: 70 mg/dL (ref >39)
LDL Chol Calc (NIH): 89 mg/dL (ref 0–99)
Triglycerides: 41 mg/dL (ref 0–149)
VLDL Cholesterol Cal: 9 mg/dL (ref 5–40)

## 2019-07-17 LAB — COMPREHENSIVE METABOLIC PANEL
ALT: 23 IU/L (ref 0–32)
AST: 27 IU/L (ref 0–40)
Albumin/Globulin Ratio: 1.9 (ref 1.2–2.2)
Albumin: 4.2 g/dL (ref 3.8–4.8)
Alkaline Phosphatase: 88 IU/L (ref 39–117)
BUN/Creatinine Ratio: 24 — ABNORMAL HIGH (ref 9–23)
BUN: 20 mg/dL (ref 6–20)
Bilirubin Total: 0.2 mg/dL (ref 0.0–1.2)
CO2: 21 mmol/L (ref 20–29)
Calcium: 9.3 mg/dL (ref 8.7–10.2)
Chloride: 105 mmol/L (ref 96–106)
Creatinine, Ser: 0.85 mg/dL (ref 0.57–1.00)
GFR calc Af Amer: 104 mL/min/{1.73_m2} (ref >59)
GFR calc non Af Amer: 90 mL/min/{1.73_m2} (ref >59)
Globulin, Total: 2.2 g/dL (ref 1.5–4.5)
Glucose: 85 mg/dL (ref 65–99)
Potassium: 4.2 mmol/L (ref 3.5–5.2)
Sodium: 138 mmol/L (ref 134–144)
Total Protein: 6.4 g/dL (ref 6.0–8.5)

## 2019-07-17 LAB — CBC WITH DIFFERENTIAL/PLATELET
Basophils Absolute: 0.1 10*3/uL (ref 0.0–0.2)
Basos: 1 %
EOS (ABSOLUTE): 0.1 10*3/uL (ref 0.0–0.4)
Eos: 1 %
Hematocrit: 39.1 % (ref 34.0–46.6)
Hemoglobin: 13 g/dL (ref 11.1–15.9)
Immature Grans (Abs): 0 10*3/uL (ref 0.0–0.1)
Immature Granulocytes: 0 %
Lymphocytes Absolute: 1.7 10*3/uL (ref 0.7–3.1)
Lymphs: 23 %
MCH: 27.5 pg (ref 26.6–33.0)
MCHC: 33.2 g/dL (ref 31.5–35.7)
MCV: 83 fL (ref 79–97)
Monocytes Absolute: 0.5 10*3/uL (ref 0.1–0.9)
Monocytes: 7 %
Neutrophils Absolute: 5 10*3/uL (ref 1.4–7.0)
Neutrophils: 68 %
Platelets: 311 10*3/uL (ref 150–450)
RBC: 4.73 x10E6/uL (ref 3.77–5.28)
RDW: 12.8 % (ref 11.7–15.4)
WBC: 7.4 10*3/uL (ref 3.4–10.8)

## 2019-07-17 LAB — TSH: TSH: 0.474 u[IU]/mL (ref 0.450–4.500)

## 2019-07-17 LAB — HIV ANTIBODY (ROUTINE TESTING W REFLEX): HIV Screen 4th Generation wRfx: NONREACTIVE

## 2019-10-25 DIAGNOSIS — Z20822 Contact with and (suspected) exposure to covid-19: Secondary | ICD-10-CM | POA: Diagnosis not present

## 2019-10-26 DIAGNOSIS — Z20822 Contact with and (suspected) exposure to covid-19: Secondary | ICD-10-CM | POA: Diagnosis not present

## 2020-03-07 DIAGNOSIS — Z23 Encounter for immunization: Secondary | ICD-10-CM | POA: Diagnosis not present

## 2020-03-16 DIAGNOSIS — M21241 Flexion deformity, right finger joints: Secondary | ICD-10-CM | POA: Diagnosis not present

## 2020-03-16 DIAGNOSIS — M7989 Other specified soft tissue disorders: Secondary | ICD-10-CM | POA: Diagnosis not present

## 2020-03-16 DIAGNOSIS — S6991XA Unspecified injury of right wrist, hand and finger(s), initial encounter: Secondary | ICD-10-CM | POA: Diagnosis not present

## 2020-10-17 ENCOUNTER — Encounter: Payer: BC Managed Care – PPO | Admitting: Family Medicine

## 2020-10-26 ENCOUNTER — Encounter: Payer: BC Managed Care – PPO | Admitting: Family Medicine

## 2020-11-03 DIAGNOSIS — Z03818 Encounter for observation for suspected exposure to other biological agents ruled out: Secondary | ICD-10-CM | POA: Diagnosis not present

## 2020-11-03 DIAGNOSIS — Z20822 Contact with and (suspected) exposure to covid-19: Secondary | ICD-10-CM | POA: Diagnosis not present

## 2020-11-06 ENCOUNTER — Ambulatory Visit (INDEPENDENT_AMBULATORY_CARE_PROVIDER_SITE_OTHER): Payer: BC Managed Care – PPO | Admitting: Family Medicine

## 2020-11-06 ENCOUNTER — Encounter: Payer: Self-pay | Admitting: Family Medicine

## 2020-11-06 ENCOUNTER — Other Ambulatory Visit: Payer: Self-pay

## 2020-11-06 VITALS — BP 130/76 | HR 66 | Temp 98.0°F | Ht 70.0 in | Wt 165.0 lb

## 2020-11-06 DIAGNOSIS — Z1159 Encounter for screening for other viral diseases: Secondary | ICD-10-CM | POA: Diagnosis not present

## 2020-11-06 DIAGNOSIS — Z0000 Encounter for general adult medical examination without abnormal findings: Secondary | ICD-10-CM

## 2020-11-06 NOTE — Progress Notes (Signed)
Complete physical exam   Patient: Joy Lawson   DOB: December 21, 1985   35 y.o. Female  MRN: [id number] Visit Date: 11/06/2020  Today's healthcare provider: Dortha Kern, PA-C   No chief complaint on file.  Subjective    Joy Lawson is a 35 y.o. female who presents today for a complete physical exam.  She reports consuming a general diet.  She generally feels well. She reports sleeping well. She does not have additional problems to discuss today.    No past medical history on file.  No past surgical history on file.  Social History   Socioeconomic History   Marital status: Married    Spouse name: Not on file   Number of children: Not on file   Years of education: Not on file   Highest education level: Not on file  Occupational History   Not on file  Tobacco Use   Smoking status: Never   Smokeless tobacco: Never  Substance and Sexual Activity   Alcohol use: No   Drug use: Not on file   Sexual activity: Not on file  Other Topics Concern   Not on file  Social History Narrative   Not on file   Social Determinants of Health   Financial Resource Strain: Not on file  Food Insecurity: Not on file  Transportation Needs: Not on file  Physical Activity: Not on file  Stress: Not on file  Social Connections: Not on file  Intimate Partner Violence: Not on file   Family Status  Relation Name Status   Mother  Alive   Father  Alive   No family history on file. No Known Allergies  Patient Care Team: Jacky Kindle, FNP as PCP - General (Family Medicine)   Medications: No outpatient medications prior to visit.   No facility-administered medications prior to visit.    Review of Systems  Constitutional: Negative.   HENT: Negative.    Eyes: Negative.   Respiratory: Negative.    Cardiovascular: Negative.   Gastrointestinal: Negative.   Endocrine: Negative.   Genitourinary: Negative.   Musculoskeletal: Negative.   Skin: Negative.   Allergic/Immunologic:  Negative.   Neurological: Negative.   Hematological: Negative.   Psychiatric/Behavioral: Negative.        Objective    BP 130/76 (BP Location: Right Arm, Patient Position: Sitting, Cuff Size: Normal)   Pulse 66   Temp 98 F (36.7 C) (Oral)   Ht 5\' 10"  (1.778 m)   Wt 165 lb (74.8 kg)   SpO2 100%   BMI 23.68 kg/m  LMP approximately 2 weeks ago.   Physical Exam Constitutional:      Appearance: Normal appearance. She is normal weight.  HENT:     Head: Normocephalic and atraumatic.     Right Ear: Tympanic membrane, ear canal and external ear normal.     Left Ear: Tympanic membrane, ear canal and external ear normal.     Nose: Nose normal.     Mouth/Throat:     Mouth: Mucous membranes are moist.     Pharynx: Oropharynx is clear.  Eyes:     Extraocular Movements: Extraocular movements intact.     Conjunctiva/sclera: Conjunctivae normal.     Pupils: Pupils are equal, round, and reactive to light.  Cardiovascular:     Rate and Rhythm: Normal rate and regular rhythm.     Pulses: Normal pulses.     Heart sounds: Normal heart sounds.  Pulmonary:     Effort: Pulmonary effort is  normal.     Breath sounds: Normal breath sounds.  Abdominal:     General: Abdomen is flat. Bowel sounds are normal.     Palpations: Abdomen is soft.  Musculoskeletal:        General: Normal range of motion.     Cervical back: Normal range of motion and neck supple.  Skin:    General: Skin is warm and dry.  Neurological:     General: No focal deficit present.     Mental Status: She is alert and oriented to person, place, and time. Mental status is at baseline.  Psychiatric:        Mood and Affect: Mood normal.        Behavior: Behavior normal.        Thought Content: Thought content normal.        Judgment: Judgment normal.     Last depression screening scores PHQ 2/9 Scores 11/06/2020 07/16/2019 05/28/2018  PHQ - 2 Score 0 0 0  PHQ- 9 Score 0 0 0   Last fall risk screening Fall Risk  11/06/2020   Falls in the past year? 0  Number falls in past yr: 0  Injury with Fall? 0   Last Audit-C alcohol use screening Alcohol Use Disorder Test (AUDIT) 11/06/2020  1. How often do you have a drink containing alcohol? 0  2. How many drinks containing alcohol do you have on a typical day when you are drinking? 0  3. How often do you have six or more drinks on one occasion? 0  AUDIT-C Score 0   A score of 3 or more in women, and 4 or more in men indicates increased risk for alcohol abuse, EXCEPT if all of the points are from question 1   No results found for any visits on 11/06/20.  Assessment & Plan    Routine Health Maintenance and Physical Exam  Exercise Activities and Dietary recommendations  Goals   Continue sports for exercise (hammer thrower) and regular balance diet.     Immunization History  Administered Date(s) Administered   Tdap 07/16/2019    Health Maintenance  Topic Date Due   Hepatitis C Screening  Never done   INFLUENZA VACCINE  11/06/2020   PAP SMEAR-Modifier  05/28/2021   TETANUS/TDAP  07/15/2029   HIV Screening  Completed   Pneumococcal Vaccine 52-32 Years old  Aged Out   HPV VACCINES  Aged Out    Discussed health benefits of physical activity, and encouraged her to engage in regular exercise appropriate for her age and condition.  1. Annual physical exam Good general health. Counseled regarding health maintenance. Will get routine labs. - CBC with Differential/Platelet - Comprehensive metabolic panel - TSH - Lipid panel  2. Need for hepatitis C screening test - Hepatitis C antibody   No follow-ups on file.     I, Genetta Fiero, PA-C, have reviewed all documentation for this visit. The documentation on 11/06/20 for the exam, diagnosis, procedures, and orders are all accurate and complete.    Dortha Kern, PA-C  [facility] [phone] (phone) [phone] (fax)  [facility]

## 2020-11-17 DIAGNOSIS — Z0000 Encounter for general adult medical examination without abnormal findings: Secondary | ICD-10-CM | POA: Diagnosis not present

## 2020-11-17 DIAGNOSIS — Z1159 Encounter for screening for other viral diseases: Secondary | ICD-10-CM | POA: Diagnosis not present

## 2020-11-18 LAB — COMPREHENSIVE METABOLIC PANEL
ALT: 30 IU/L (ref 0–32)
AST: 29 IU/L (ref 0–40)
Albumin/Globulin Ratio: 2.3 — ABNORMAL HIGH (ref 1.2–2.2)
Albumin: 4.6 g/dL (ref 3.8–4.8)
Alkaline Phosphatase: 92 IU/L (ref 44–121)
BUN/Creatinine Ratio: 16 (ref 9–23)
BUN: 16 mg/dL (ref 6–20)
Bilirubin Total: 0.3 mg/dL (ref 0.0–1.2)
CO2: 20 mmol/L (ref 20–29)
Calcium: 9.6 mg/dL (ref 8.7–10.2)
Chloride: 102 mmol/L (ref 96–106)
Creatinine, Ser: 0.98 mg/dL (ref 0.57–1.00)
Globulin, Total: 2 g/dL (ref 1.5–4.5)
Glucose: 81 mg/dL (ref 65–99)
Potassium: 4.1 mmol/L (ref 3.5–5.2)
Sodium: 137 mmol/L (ref 134–144)
Total Protein: 6.6 g/dL (ref 6.0–8.5)
eGFR: 78 mL/min/{1.73_m2} (ref >59)

## 2020-11-18 LAB — CBC WITH DIFFERENTIAL/PLATELET
Basophils Absolute: 0.1 10*3/uL (ref 0.0–0.2)
Basos: 1 %
EOS (ABSOLUTE): 0.1 10*3/uL (ref 0.0–0.4)
Eos: 2 %
Hematocrit: 40.9 % (ref 34.0–46.6)
Hemoglobin: 13.6 g/dL (ref 11.1–15.9)
Immature Grans (Abs): 0 10*3/uL (ref 0.0–0.1)
Immature Granulocytes: 0 %
Lymphocytes Absolute: 1.5 10*3/uL (ref 0.7–3.1)
Lymphs: 32 %
MCH: 27.1 pg (ref 26.6–33.0)
MCHC: 33.3 g/dL (ref 31.5–35.7)
MCV: 82 fL (ref 79–97)
Monocytes Absolute: 0.3 10*3/uL (ref 0.1–0.9)
Monocytes: 7 %
Neutrophils Absolute: 2.6 10*3/uL (ref 1.4–7.0)
Neutrophils: 58 %
Platelets: 317 10*3/uL (ref 150–450)
RBC: 5.01 x10E6/uL (ref 3.77–5.28)
RDW: 13 % (ref 11.7–15.4)
WBC: 4.6 10*3/uL (ref 3.4–10.8)

## 2020-11-18 LAB — LIPID PANEL
Chol/HDL Ratio: 2.4 ratio (ref 0.0–4.4)
Cholesterol, Total: 177 mg/dL (ref 100–199)
HDL: 75 mg/dL (ref >39)
LDL Chol Calc (NIH): 95 mg/dL (ref 0–99)
Triglycerides: 29 mg/dL (ref 0–149)
VLDL Cholesterol Cal: 7 mg/dL (ref 5–40)

## 2020-11-18 LAB — HEPATITIS C ANTIBODY: Hep C Virus Ab: <0.1 s/co ratio (ref 0.0–0.9)

## 2020-11-18 LAB — TSH: TSH: 0.603 u[IU]/mL (ref 0.450–4.500)

## 2021-07-18 DIAGNOSIS — X500XXA Overexertion from strenuous movement or load, initial encounter: Secondary | ICD-10-CM | POA: Diagnosis not present

## 2021-07-18 DIAGNOSIS — M7660 Achilles tendinitis, unspecified leg: Secondary | ICD-10-CM | POA: Diagnosis not present

## 2021-07-18 DIAGNOSIS — Y93B9 Activity, other involving muscle strengthening exercises: Secondary | ICD-10-CM | POA: Diagnosis not present

## 2021-07-18 DIAGNOSIS — S86011A Strain of right Achilles tendon, initial encounter: Secondary | ICD-10-CM | POA: Diagnosis not present

## 2021-07-18 DIAGNOSIS — M7661 Achilles tendinitis, right leg: Secondary | ICD-10-CM | POA: Diagnosis not present

## 2021-07-19 DIAGNOSIS — X500XXA Overexertion from strenuous movement or load, initial encounter: Secondary | ICD-10-CM | POA: Diagnosis not present

## 2021-07-19 DIAGNOSIS — S86011A Strain of right Achilles tendon, initial encounter: Secondary | ICD-10-CM | POA: Diagnosis not present

## 2021-07-26 DIAGNOSIS — S86011A Strain of right Achilles tendon, initial encounter: Secondary | ICD-10-CM | POA: Diagnosis not present

## 2021-07-26 DIAGNOSIS — X58XXXA Exposure to other specified factors, initial encounter: Secondary | ICD-10-CM | POA: Diagnosis not present

## 2021-07-31 DIAGNOSIS — S86001A Unspecified injury of right Achilles tendon, initial encounter: Secondary | ICD-10-CM | POA: Diagnosis not present

## 2021-11-14 ENCOUNTER — Ambulatory Visit (INDEPENDENT_AMBULATORY_CARE_PROVIDER_SITE_OTHER): Payer: BC Managed Care – PPO | Admitting: Family Medicine

## 2021-11-14 ENCOUNTER — Encounter: Payer: Self-pay | Admitting: Family Medicine

## 2021-11-14 ENCOUNTER — Other Ambulatory Visit (HOSPITAL_COMMUNITY)
Admission: RE | Admit: 2021-11-14 | Discharge: 2021-11-14 | Disposition: A | Discharge status: Home / Self Care | Payer: BC Managed Care – PPO | Source: Ambulatory Visit | Attending: Family Medicine | Admitting: Family Medicine

## 2021-11-14 VITALS — BP 128/86 | HR 71 | Temp 97.8°F | Resp 16 | Ht 68.0 in | Wt 174.0 lb

## 2021-11-14 DIAGNOSIS — Z124 Encounter for screening for malignant neoplasm of cervix: Secondary | ICD-10-CM | POA: Diagnosis not present

## 2021-11-14 DIAGNOSIS — R635 Abnormal weight gain: Secondary | ICD-10-CM | POA: Diagnosis not present

## 2021-11-14 DIAGNOSIS — Z0000 Encounter for general adult medical examination without abnormal findings: Secondary | ICD-10-CM | POA: Diagnosis not present

## 2021-11-14 NOTE — Progress Notes (Signed)
Complete physical exam   Patient: Joy Lawson   DOB: [birth year]   36 y.o. Female  MRN: [id number] Visit Date: 11/14/2021  Today's healthcare provider: Gwyneth Sprout, FNP  Patient presents for new patient visit to establish care.  Introduced to Designer, jewellery role and practice setting.  All questions answered.  Discussed provider/patient relationship and expectations.   I,Tiffany J Bragg,acting as a scribe for Gwyneth Sprout, FNP.,have documented all relevant documentation on the behalf of Gwyneth Sprout, FNP,as directed by  Gwyneth Sprout, FNP while in the presence of Gwyneth Sprout, FNP.   Chief Complaint  Patient presents with   Annual Exam   Subjective    Joy Lawson is a 36 y.o. female who presents today for a complete physical exam.  She reports consuming a  Healthy  diet. Gym/ health club routine includes cardio, mod to heavy weightlifting, and 6 days a week. She generally feels well. She reports sleeping well. She does not have additional problems to discuss today.  HPI    History reviewed. No pertinent past medical history. History reviewed. No pertinent surgical history. Social History   Socioeconomic History   Marital status: Married    Spouse name: Not on file   Number of children: Not on file   Years of education: Not on file   Highest education level: Not on file  Occupational History   Not on file  Tobacco Use   Smoking status: Never   Smokeless tobacco: Never  Substance and Sexual Activity   Alcohol use: No   Drug use: Not on file   Sexual activity: Not on file  Other Topics Concern   Not on file  Social History Narrative   Not on file   Social Determinants of Health   Financial Resource Strain: Not on file  Food Insecurity: Not on file  Transportation Needs: Not on file  Physical Activity: Not on file  Stress: Not on file  Social Connections: Not on file  Intimate Partner Violence: Not on file   Family Status  Relation Name Status    Mother  Alive   Father  Alive   History reviewed. No pertinent family history. No Known Allergies  Patient Care Team: Gwyneth Sprout, FNP as PCP - General (Family Medicine)   Medications: No outpatient medications prior to visit.   No facility-administered medications prior to visit.    Review of Systems  Last CBC Lab Results  Component Value Date   WBC 4.6 11/17/2020   HGB 13.6 11/17/2020   HCT 40.9 11/17/2020   MCV 82 11/17/2020   MCH 27.1 11/17/2020   RDW 13.0 11/17/2020   PLT 317 53/20/2334   Last metabolic panel Lab Results  Component Value Date   GLUCOSE 81 11/17/2020   NA 137 11/17/2020   K 4.1 11/17/2020   CL 102 11/17/2020   CO2 20 11/17/2020   BUN 16 11/17/2020   CREATININE 0.98 11/17/2020   EGFR 78 11/17/2020   CALCIUM 9.6 11/17/2020   PROT 6.6 11/17/2020   ALBUMIN 4.6 11/17/2020   LABGLOB 2.0 11/17/2020   AGRATIO 2.3 (H) 11/17/2020   BILITOT 0.3 11/17/2020   ALKPHOS 92 11/17/2020   AST 29 11/17/2020   ALT 30 11/17/2020   Last lipids Lab Results  Component Value Date   CHOL 177 11/17/2020   HDL 75 11/17/2020   LDLCALC 95 11/17/2020   TRIG 29 11/17/2020   CHOLHDL 2.4 11/17/2020   Last hemoglobin A1c  Lab Results  Component Value Date   HGBA1C 5.3 04/15/2018   Last thyroid functions Lab Results  Component Value Date   TSH 0.603 11/17/2020   Last vitamin D No results found for: "25OHVITD2", "25OHVITD3", "VD25OH" Last vitamin B12 and Folate No results found for: "VITAMINB12", "FOLATE"    Objective     BP 128/86 (BP Location: Right Arm, Patient Position: Sitting, Cuff Size: Normal)   Pulse 71   Temp 97.8 F (36.6 C) (Oral)   Resp 16   Ht 5' 8"  (1.727 m)   Wt 174 lb (78.9 kg)   SpO2 98%   BMI 26.46 kg/m  BP Readings from Last 3 Encounters:  11/14/21 128/86  11/06/20 130/76  07/16/19 130/88   Wt Readings from Last 3 Encounters:  11/14/21 174 lb (78.9 kg)  11/06/20 165 lb (74.8 kg)  07/16/19 165 lb 6.4 oz (75 kg)   SpO2  Readings from Last 3 Encounters:  11/14/21 98%  11/06/20 100%  07/15/16 99%       Physical Exam Vitals and nursing note reviewed. Exam conducted with a chaperone present.  Constitutional:      General: She is awake. She is not in acute distress.    Appearance: Normal appearance. She is well-developed, well-groomed and overweight. She is not ill-appearing, toxic-appearing or diaphoretic.  HENT:     Head: Normocephalic and atraumatic.     Jaw: There is normal jaw occlusion. No trismus, tenderness, swelling or pain on movement.     Right Ear: Hearing, tympanic membrane, ear canal and external ear normal. There is no impacted cerumen.     Left Ear: Hearing, tympanic membrane, ear canal and external ear normal. There is no impacted cerumen.     Nose: Nose normal. No congestion or rhinorrhea.     Right Turbinates: Not enlarged, swollen or pale.     Left Turbinates: Not enlarged, swollen or pale.     Right Sinus: No maxillary sinus tenderness or frontal sinus tenderness.     Left Sinus: No maxillary sinus tenderness or frontal sinus tenderness.     Mouth/Throat:     Lips: Pink.     Mouth: Mucous membranes are moist. No injury.     Tongue: No lesions.     Pharynx: Oropharynx is clear. Uvula midline. No pharyngeal swelling, oropharyngeal exudate, posterior oropharyngeal erythema or uvula swelling.     Tonsils: No tonsillar exudate or tonsillar abscesses.  Eyes:     General: Lids are normal. Lids are everted, no foreign bodies appreciated. Vision grossly intact. Gaze aligned appropriately. No allergic shiner or visual field deficit.       Right eye: No discharge.        Left eye: No discharge.     Extraocular Movements: Extraocular movements intact.     Conjunctiva/sclera: Conjunctivae normal.     Right eye: Right conjunctiva is not injected. No exudate.    Left eye: Left conjunctiva is not injected. No exudate.    Pupils: Pupils are equal, round, and reactive to light.  Neck:      Thyroid: No thyroid mass, thyromegaly or thyroid tenderness.     Vascular: No carotid bruit.     Trachea: Trachea normal.  Cardiovascular:     Rate and Rhythm: Normal rate and regular rhythm.     Pulses: Normal pulses.          Carotid pulses are 2+ on the right side and 2+ on the left side.      Radial  pulses are 2+ on the right side and 2+ on the left side.       Dorsalis pedis pulses are 2+ on the right side and 2+ on the left side.       Posterior tibial pulses are 2+ on the right side and 2+ on the left side.     Heart sounds: Normal heart sounds, S1 normal and S2 normal. No murmur heard.    No friction rub. No gallop.  Pulmonary:     Effort: Pulmonary effort is normal. No respiratory distress.     Breath sounds: Normal breath sounds and air entry. No stridor. No wheezing, rhonchi or rales.  Chest:     Chest wall: No tenderness.     Comments: Breasts: breasts appear normal, no suspicious masses, no skin or nipple changes or axillary nodes, right breast normal without mass, skin or nipple changes or axillary nodes, left breast normal without mass, skin or nipple changes or axillary nodes, risk and benefit of breast self-exam was discussed  Abdominal:     General: Abdomen is flat. Bowel sounds are normal. There is no distension.     Palpations: Abdomen is soft. There is no mass.     Tenderness: There is no abdominal tenderness. There is no right CVA tenderness, left CVA tenderness, guarding or rebound.     Hernia: No hernia is present.  Genitourinary:    General: Normal vulva.     Exam position: Lithotomy position.     Tanner stage (genital): 5.     Vagina: Vaginal discharge present.     Cervix: Normal.     Uterus: Normal.      Adnexa: Right adnexa normal and left adnexa normal.     Comments: PAP completed; white discharge present, denies complaints Musculoskeletal:        General: No swelling, tenderness, deformity or signs of injury. Normal range of motion.     Cervical back:  Full passive range of motion without pain, normal range of motion and neck supple. No edema, rigidity or tenderness. No muscular tenderness.     Right lower leg: No edema.     Left lower leg: No edema.  Lymphadenopathy:     Cervical: No cervical adenopathy.     Right cervical: No superficial, deep or posterior cervical adenopathy.    Left cervical: No superficial, deep or posterior cervical adenopathy.  Skin:    General: Skin is warm and dry.     Capillary Refill: Capillary refill takes less than 2 seconds.     Coloration: Skin is not jaundiced or pale.     Findings: No bruising, erythema, lesion or rash.  Neurological:     General: No focal deficit present.     Mental Status: She is alert and oriented to person, place, and time. Mental status is at baseline.     GCS: GCS eye subscore is 4. GCS verbal subscore is 5. GCS motor subscore is 6.     Sensory: Sensation is intact. No sensory deficit.     Motor: Motor function is intact. No weakness.     Coordination: Coordination is intact. Coordination normal.     Gait: Gait is intact. Gait normal.  Psychiatric:        Attention and Perception: Attention and perception normal.        Mood and Affect: Mood and affect normal.        Speech: Speech normal.        Behavior: Behavior normal. Behavior is  cooperative.        Thought Content: Thought content normal.        Cognition and Memory: Cognition and memory normal.        Judgment: Judgment normal.     Last depression screening scores    11/14/2021   10:43 AM 11/06/2020    1:15 PM 07/16/2019   11:04 AM  PHQ 2/9 Scores  PHQ - 2 Score 0 0 0  PHQ- 9 Score 0 0 0   Last fall risk screening    11/14/2021   10:43 AM  [place] in the past year? 0  Number falls in past yr: 0  Injury with Fall? 0   Last Audit-C alcohol use screening    11/14/2021   10:44 AM  Alcohol Use Disorder Test (AUDIT)  1. How often do you have a drink containing alcohol? 1  2. How many drinks containing  alcohol do you have on a typical day when you are drinking? 0  3. How often do you have six or more drinks on one occasion? 0  AUDIT-C Score 1   A score of 3 or more in women, and 4 or more in men indicates increased risk for alcohol abuse, EXCEPT if all of the points are from question 1   No results found for any visits on 11/14/21.  Assessment & Plan    Routine Health Maintenance and Physical Exam  Exercise Activities and Dietary recommendations  Goals   None     Immunization History  Administered Date(s) Administered   Tdap 07/16/2019    Health Maintenance  Topic Date Due   PAP SMEAR-Modifier  05/28/2021   INFLUENZA VACCINE  11/06/2021   TETANUS/TDAP  07/15/2029   Hepatitis C Screening  Completed   HIV Screening  Completed   HPV VACCINES  Aged Out    Discussed health benefits of physical activity, and encouraged her to engage in regular exercise appropriate for her age and condition.  Problem List Items Addressed This Visit       Other   Annual physical exam - Primary    UTD on dental Denies vision concerns Working through R tendon tear Works at [employer] to do to keep yourself healthy  - Exercise at least 30-45 minutes a day, 3-4 days a week.  - Eat a low-fat diet with lots of fruits and vegetables, up to 7-9 servings per day.  - Seatbelts can save your life. Wear them always.  - Smoke detectors on every level of your home, check batteries every year.  - Eye Doctor - have an eye exam every 1-2 years  - Safe sex - if you may be exposed to STDs, use a condom.  - Alcohol -  If you drink, do it moderately, less than 2 drinks per day.  - [place]. Choose someone to speak for you if you are not able.  - Depression is common in our stressful world.If you're feeling down or losing interest in things you normally enjoy, please come in for a visit.  - Violence - If anyone is threatening or hurting you, please call immediately.        Relevant  Orders   Comprehensive metabolic panel   TSH + free T4   CBC with Differential/Platelet   Lipid panel   Cytology - PAP   Screening for cervical cancer    <5 years since previous PAP smear; however, no transformation zone present and no  HPV testing despite >74 years old Repeat today Denies vaginal complaints       Weight gain finding    10# weight gain noted; request for additional vitamin checks with excessive workout/lifting routine Stable bmi at Body mass index is 26.46 kg/m.       Relevant Orders   B12 and Folate Panel   Vitamin D (25 hydroxy)     Return in about 1 year (around 11/15/2022) for annual examination.    Vonna Kotyk, FNP, have reviewed all documentation for this visit. The documentation on 11/14/21 for the exam, diagnosis, procedures, and orders are all accurate and complete.    Gwyneth Sprout, [place] [phone] (phone) [phone] (fax)  [place]

## 2021-11-14 NOTE — Assessment & Plan Note (Signed)
10# weight gain noted; request for additional vitamin checks with excessive workout/lifting routine Stable bmi at Body mass index is 26.46 kg/m.

## 2021-11-14 NOTE — Assessment & Plan Note (Signed)
<  5 years since previous PAP smear; however, no transformation zone present and no HPV testing despite >36 years old Repeat today Denies vaginal complaints

## 2021-11-14 NOTE — Assessment & Plan Note (Signed)
UTD on dental Denies vision concerns Working through R tendon tear Works at [employer] to do to keep yourself healthy  - Exercise at least 30-45 minutes a day, 3-4 days a week.  - Eat a low-fat diet with lots of fruits and vegetables, up to 7-9 servings per day.  - Seatbelts can save your life. Wear them always.  - Smoke detectors on every level of your home, check batteries every year.  - Eye Doctor - have an eye exam every 1-2 years  - Safe sex - if you may be exposed to STDs, use a condom.  - Alcohol -  If you drink, do it moderately, less than 2 drinks per day.  - Health Care Power of Attorney. Choose someone to speak for you if you are not able.  - Depression is common in our stressful world.If you're feeling down or losing interest in things you normally enjoy, please come in for a visit.  - Violence - If anyone is threatening or hurting you, please call immediately.

## 2021-11-15 LAB — CBC WITH DIFFERENTIAL/PLATELET
Basophils Absolute: 0.1 10*3/uL (ref 0.0–0.2)
Basos: 1 %
EOS (ABSOLUTE): 0.1 10*3/uL (ref 0.0–0.4)
Eos: 2 %
Hematocrit: 40.3 % (ref 34.0–46.6)
Hemoglobin: 13.1 g/dL (ref 11.1–15.9)
Immature Grans (Abs): 0 10*3/uL (ref 0.0–0.1)
Immature Granulocytes: 0 %
Lymphocytes Absolute: 1.9 10*3/uL (ref 0.7–3.1)
Lymphs: 27 %
MCH: 26.6 pg (ref 26.6–33.0)
MCHC: 32.5 g/dL (ref 31.5–35.7)
MCV: 82 fL (ref 79–97)
Monocytes Absolute: 0.5 10*3/uL (ref 0.1–0.9)
Monocytes: 8 %
Neutrophils Absolute: 4.2 10*3/uL (ref 1.4–7.0)
Neutrophils: 62 %
Platelets: 332 10*3/uL (ref 150–450)
RBC: 4.92 x10E6/uL (ref 3.77–5.28)
RDW: 13.1 % (ref 11.7–15.4)
WBC: 6.8 10*3/uL (ref 3.4–10.8)

## 2021-11-15 LAB — COMPREHENSIVE METABOLIC PANEL
ALT: 21 IU/L (ref 0–32)
AST: 21 IU/L (ref 0–40)
Albumin/Globulin Ratio: 2.2 (ref 1.2–2.2)
Albumin: 4.4 g/dL (ref 3.9–4.9)
Alkaline Phosphatase: 91 IU/L (ref 44–121)
BUN/Creatinine Ratio: 21 (ref 9–23)
BUN: 17 mg/dL (ref 6–20)
Bilirubin Total: 0.2 mg/dL (ref 0.0–1.2)
CO2: 22 mmol/L (ref 20–29)
Calcium: 9.6 mg/dL (ref 8.7–10.2)
Chloride: 102 mmol/L (ref 96–106)
Creatinine, Ser: 0.82 mg/dL (ref 0.57–1.00)
Globulin, Total: 2 g/dL (ref 1.5–4.5)
Glucose: 69 mg/dL — ABNORMAL LOW (ref 70–99)
Potassium: 4 mmol/L (ref 3.5–5.2)
Sodium: 137 mmol/L (ref 134–144)
Total Protein: 6.4 g/dL (ref 6.0–8.5)
eGFR: 96 mL/min/{1.73_m2} (ref >59)

## 2021-11-15 LAB — VITAMIN D 25 HYDROXY (VIT D DEFICIENCY, FRACTURES): Vit D, 25-Hydroxy: 32.5 ng/mL (ref 30.0–100.0)

## 2021-11-15 LAB — LIPID PANEL
Chol/HDL Ratio: 2.3 ratio (ref 0.0–4.4)
Cholesterol, Total: 150 mg/dL (ref 100–199)
HDL: 64 mg/dL (ref >39)
LDL Chol Calc (NIH): 75 mg/dL (ref 0–99)
Triglycerides: 51 mg/dL (ref 0–149)
VLDL Cholesterol Cal: 11 mg/dL (ref 5–40)

## 2021-11-15 LAB — TSH+FREE T4
Free T4: 1.34 ng/dL (ref 0.82–1.77)
TSH: 0.552 u[IU]/mL (ref 0.450–4.500)

## 2021-11-15 LAB — B12 AND FOLATE PANEL
Folate: 9 ng/mL (ref >3.0)
Vitamin B-12: 666 pg/mL (ref 232–1245)

## 2021-11-15 NOTE — Progress Notes (Signed)
All labs are normal and stable; borderline Vit D. Recommend use of multi-vitamin with vitamin d of 1000-2000 IU/dose.  Jacky Kindle, FNP  [facility] [address] [place], [place] [address] [phone] (phone) [phone] (fax) [facility]

## 2021-11-16 ENCOUNTER — Telehealth: Payer: Self-pay

## 2021-11-16 NOTE — Telephone Encounter (Signed)
Spoke with patient and asked what concerns she had. Let her know that per [name]'s note she did not mention anything about low glucose and her lab result shows 1 pt under what is considered normal, so that is probably why nothing was mentions. She seemed okay with this and had no concerns if [facility] didn't.

## 2021-11-16 NOTE — Telephone Encounter (Signed)
Pt returned our call. Shared provider's note with pt. PT would like more information regarding low glucose.  Jacky Kindle, FNP  11/15/2021  7:53 AM EDT     All labs are normal and stable; borderline Vit D. Recommend use of multi-vitamin with vitamin d of 1000-2000 IU/dose.   Jacky Kindle, FNP  [facility] [address] [place], [place] [address] [phone] (phone) [phone] (fax) [facility]

## 2021-11-19 LAB — CYTOLOGY - PAP
Chlamydia: NEGATIVE
Comment: NEGATIVE
Comment: NEGATIVE
Comment: NEGATIVE
Comment: NEGATIVE
Comment: NORMAL
Diagnosis: NEGATIVE
HSV1: NEGATIVE
HSV2: NEGATIVE
High risk HPV: NEGATIVE
Neisseria Gonorrhea: NEGATIVE
Trichomonas: NEGATIVE

## 2021-11-19 NOTE — Progress Notes (Signed)
Normal PAP; no HPV or STIs.  Repeat in 5 years.  Jacky Kindle, FNP  [facility] [address] [place], [place] [address] [phone] (phone) [phone] (fax) [facility]

## 2022-01-23 DIAGNOSIS — X500XXA Overexertion from strenuous movement or load, initial encounter: Secondary | ICD-10-CM | POA: Diagnosis not present

## 2022-01-23 DIAGNOSIS — Z9889 Other specified postprocedural states: Secondary | ICD-10-CM | POA: Diagnosis not present

## 2022-01-23 DIAGNOSIS — Y93B9 Activity, other involving muscle strengthening exercises: Secondary | ICD-10-CM | POA: Diagnosis not present

## 2022-01-23 DIAGNOSIS — S86011A Strain of right Achilles tendon, initial encounter: Secondary | ICD-10-CM | POA: Diagnosis not present

## 2022-11-19 ENCOUNTER — Encounter: Payer: Self-pay | Admitting: Family Medicine

## 2022-11-19 ENCOUNTER — Ambulatory Visit (INDEPENDENT_AMBULATORY_CARE_PROVIDER_SITE_OTHER): Payer: BC Managed Care – PPO | Admitting: Family Medicine

## 2022-11-19 VITALS — BP 113/76 | HR 74 | Ht 69.0 in | Wt 174.4 lb

## 2022-11-19 DIAGNOSIS — Z0000 Encounter for general adult medical examination without abnormal findings: Secondary | ICD-10-CM | POA: Diagnosis not present

## 2022-11-19 DIAGNOSIS — Z789 Other specified health status: Secondary | ICD-10-CM | POA: Insufficient documentation

## 2022-11-19 NOTE — Progress Notes (Signed)
Complete physical exam   Patient: Joy Lawson   DOB: Aug 18, 1985   37 y.o. Female  MRN: [id number] Visit Date: 11/19/2022  Today's healthcare provider: Jacky Kindle, FNP   Chief Complaint  Patient presents with   Annual Exam    General diet, exercise daily sometimes twice a day for one hour to hour and a half, feeling and sleeping well.    Subjective    Joy Lawson is a 37 y.o. female who presents today for a complete physical exam.  She reports consuming a general diet. Gym/ health club routine includes light weights. She generally feels well. She reports sleeping well. She does not have additional problems to discuss today.  HPI HPI     Annual Exam    Additional comments: General diet, exercise daily sometimes twice a day for one hour to hour and a half, feeling and sleeping well.       Last edited by Acey Lav, CMA on 11/19/2022  1:41 PM.      History reviewed. No pertinent past medical history. History reviewed. No pertinent surgical history. Social History   Socioeconomic History   Marital status: Married    Spouse name: Not on file   Number of children: Not on file   Years of education: Not on file   Highest education level: Not on file  Occupational History   Not on file  Tobacco Use   Smoking status: Never   Smokeless tobacco: Never  Substance and Sexual Activity   Alcohol use: No   Drug use: Never   Sexual activity: Yes  Other Topics Concern   Not on file  Social History Narrative   Not on file   Social Determinants of Health   Financial Resource Strain: Not on file  Food Insecurity: Not on file  Transportation Needs: Not on file  Physical Activity: Not on file  Stress: Not on file  Social Connections: Not on file  Intimate Partner Violence: Not on file   Family Status  Relation Name Status   Mother  Alive   Father  Alive  No partnership data on file   History reviewed. No pertinent family history. No Known Allergies   Patient Care Team: Jacky Kindle, FNP as PCP - General (Family Medicine)   Medications: No outpatient medications prior to visit.   No facility-administered medications prior to visit.    Objective    BP 113/76 (BP Location: Left Arm, Patient Position: Sitting, Cuff Size: Normal)   Pulse 74   Ht 5\' 9"  (1.753 m)   Wt 174 lb 6.4 oz (79.1 kg)   SpO2 100%   BMI 25.75 kg/m   Physical Exam Vitals and nursing note reviewed.  Constitutional:      General: She is awake. She is not in acute distress.    Appearance: Normal appearance. She is well-developed, well-groomed and normal weight. She is not ill-appearing, toxic-appearing or diaphoretic.  HENT:     Head: Normocephalic and atraumatic.     Jaw: There is normal jaw occlusion. No trismus, tenderness, swelling or pain on movement.     Right Ear: Hearing, tympanic membrane, ear canal and external ear normal. There is no impacted cerumen.     Left Ear: Hearing, tympanic membrane, ear canal and external ear normal. There is no impacted cerumen.     Nose: Nose normal. No congestion or rhinorrhea.     Right Turbinates: Not enlarged, swollen or pale.     Left Turbinates:  Not enlarged, swollen or pale.     Right Sinus: No maxillary sinus tenderness or frontal sinus tenderness.     Left Sinus: No maxillary sinus tenderness or frontal sinus tenderness.     Mouth/Throat:     Lips: Pink.     Mouth: Mucous membranes are moist. No injury.     Tongue: No lesions.     Pharynx: Oropharynx is clear. Uvula midline. No pharyngeal swelling, oropharyngeal exudate, posterior oropharyngeal erythema or uvula swelling.     Tonsils: No tonsillar exudate or tonsillar abscesses.  Eyes:     General: Lids are normal. Lids are everted, no foreign bodies appreciated. Vision grossly intact. Gaze aligned appropriately. No allergic shiner or visual field deficit.       Right eye: No discharge.        Left eye: No discharge.     Extraocular Movements: Extraocular  movements intact.     Conjunctiva/sclera: Conjunctivae normal.     Right eye: Right conjunctiva is not injected. No exudate.    Left eye: Left conjunctiva is not injected. No exudate.    Pupils: Pupils are equal, round, and reactive to light.  Neck:     Thyroid: No thyroid mass, thyromegaly or thyroid tenderness.     Vascular: No carotid bruit.     Trachea: Trachea normal.  Cardiovascular:     Rate and Rhythm: Normal rate and regular rhythm.     Pulses: Normal pulses.          Carotid pulses are 2+ on the right side and 2+ on the left side.      Radial pulses are 2+ on the right side and 2+ on the left side.       Dorsalis pedis pulses are 2+ on the right side and 2+ on the left side.       Posterior tibial pulses are 2+ on the right side and 2+ on the left side.     Heart sounds: Normal heart sounds, S1 normal and S2 normal. No murmur heard.    No friction rub. No gallop.  Pulmonary:     Effort: Pulmonary effort is normal. No respiratory distress.     Breath sounds: Normal breath sounds and air entry. No stridor. No wheezing, rhonchi or rales.  Chest:     Chest wall: No tenderness.  Abdominal:     General: Abdomen is flat. Bowel sounds are normal. There is no distension.     Palpations: Abdomen is soft. There is no mass.     Tenderness: There is no abdominal tenderness. There is no right CVA tenderness, left CVA tenderness, guarding or rebound.     Hernia: No hernia is present.  Genitourinary:    Comments: Exam deferred; denies complaints Musculoskeletal:        General: No swelling, tenderness, deformity or signs of injury. Normal range of motion.     Cervical back: Full passive range of motion without pain, normal range of motion and neck supple. No edema, rigidity or tenderness. No muscular tenderness.     Right lower leg: No edema.     Left lower leg: No edema.  Lymphadenopathy:     Cervical: No cervical adenopathy.     Right cervical: No superficial, deep or posterior  cervical adenopathy.    Left cervical: No superficial, deep or posterior cervical adenopathy.  Skin:    General: Skin is warm and dry.     Capillary Refill: Capillary refill takes less than 2 seconds.  Coloration: Skin is not jaundiced or pale.     Findings: No bruising, erythema, lesion or rash.  Neurological:     General: No focal deficit present.     Mental Status: She is alert and oriented to person, place, and time. Mental status is at baseline.     GCS: GCS eye subscore is 4. GCS verbal subscore is 5. GCS motor subscore is 6.     Sensory: Sensation is intact. No sensory deficit.     Motor: Motor function is intact. No weakness.     Coordination: Coordination is intact. Coordination normal.     Gait: Gait is intact. Gait normal.  Psychiatric:        Attention and Perception: Attention and perception normal.        Mood and Affect: Mood and affect normal.        Speech: Speech normal.        Behavior: Behavior normal. Behavior is cooperative.        Thought Content: Thought content normal.        Cognition and Memory: Cognition and memory normal.        Judgment: Judgment normal.     Last depression screening scores    11/19/2022    1:42 PM 11/14/2021   10:43 AM 11/06/2020    1:15 PM  PHQ 2/9 Scores  PHQ - 2 Score 0 0 0  PHQ- 9 Score  0 0   Last fall risk screening    11/19/2022    1:42 PM  Fall Risk   Falls in the past year? 0  Injury with Fall? 0  Risk for fall due to : No Fall Risks  Follow up Falls evaluation completed   Last Audit-C alcohol use screening    11/14/2021   10:44 AM  Alcohol Use Disorder Test (AUDIT)  1. How often do you have a drink containing alcohol? 1  2. How many drinks containing alcohol do you have on a typical day when you are drinking? 0  3. How often do you have six or more drinks on one occasion? 0  AUDIT-C Score 1   A score of 3 or more in women, and 4 or more in men indicates increased risk for alcohol abuse, EXCEPT if all of the  points are from question 1   No results found for any visits on 11/19/22.  Assessment & Plan    Routine Health Maintenance and Physical Exam  Exercise Activities and Dietary recommendations  Goals   None     Immunization History  Administered Date(s) Administered   Tdap 07/16/2019    Health Maintenance  Topic Date Due   COVID-19 Vaccine (1 - 2023-24 season) Never done   INFLUENZA VACCINE  07/07/2023 (Originally 11/07/2022)   PAP SMEAR-Modifier  11/14/2024   DTaP/Tdap/Td (2 - Td or Tdap) 07/15/2029   Hepatitis C Screening  Completed   HIV Screening  Completed   HPV VACCINES  Aged Out    Discussed health benefits of physical activity, and encouraged her to engage in regular exercise appropriate for her age and condition.  Problem List Items Addressed This Visit       Other   Annual physical exam - Primary    UTD on dental 5/24; no vision complaints or skin complaints Things to do to keep yourself healthy  - Exercise at least 30-45 minutes a day, 3-4 days a week.  - Eat a low-fat diet with lots of fruits and vegetables, up to  7-9 servings per day.  - Seatbelts can save your life. Wear them always.  - Smoke detectors on every level of your home, check batteries every year.  - Eye Doctor - have an eye exam every 1-2 years  - Safe sex - if you may be exposed to STDs, use a condom.  - Alcohol -  If you drink, do it moderately, less than 2 drinks per day.  - Health Care Power of Attorney. Choose someone to speak for you if you are not able.  - Depression is common in our stressful world.If you're feeling down or losing interest in things you normally enjoy, please come in for a visit.  - Violence - If anyone is threatening or hurting you, please call immediately.       Relevant Orders   CBC with Differential/Platelet   Comprehensive Metabolic Panel (CMET)   TSH + free T4   Lipid panel   Vitamin D (25 hydroxy)   Gravida 1 para 1    Currently pregnant; awaiting [place] and  initial OB Complaints of slight nausea if she is hungry; doing well overall. Has started prenatal and is feeling well      Return in about 1 year (around 11/19/2023) for annual examination.    Leilani Merl, FNP, have reviewed all documentation for this visit. The documentation on 11/19/22 for the exam, diagnosis, procedures, and orders are all accurate and complete.  Jacky Kindle, FNP  [facility] [phone] (phone) [phone] (fax)  [facility]

## 2022-11-19 NOTE — Assessment & Plan Note (Signed)
UTD on dental 5/24; no vision complaints or skin complaints Things to do to keep yourself healthy  - Exercise at least 30-45 minutes a day, 3-4 days a week.  - Eat a low-fat diet with lots of fruits and vegetables, up to 7-9 servings per day.  - Seatbelts can save your life. Wear them always.  - Smoke detectors on every level of your home, check batteries every year.  - Eye Doctor - have an eye exam every 1-2 years  - Safe sex - if you may be exposed to STDs, use a condom.  - Alcohol -  If you drink, do it moderately, less than 2 drinks per day.  - Health Care Power of Attorney. Choose someone to speak for you if you are not able.  - Depression is common in our stressful world.If you're feeling down or losing interest in things you normally enjoy, please come in for a visit.  - Violence - If anyone is threatening or hurting you, please call immediately.

## 2022-11-19 NOTE — Assessment & Plan Note (Signed)
Currently pregnant; awaiting [place] and initial OB Complaints of slight nausea if she is hungry; doing well overall. Has started prenatal and is feeling well

## 2022-12-04 DIAGNOSIS — N912 Amenorrhea, unspecified: Secondary | ICD-10-CM | POA: Diagnosis not present

## 2022-12-05 DIAGNOSIS — O09511 Supervision of elderly primigravida, first trimester: Secondary | ICD-10-CM | POA: Diagnosis not present

## 2022-12-05 DIAGNOSIS — Z3401 Encounter for supervision of normal first pregnancy, first trimester: Secondary | ICD-10-CM | POA: Diagnosis not present

## 2022-12-05 DIAGNOSIS — Z1329 Encounter for screening for other suspected endocrine disorder: Secondary | ICD-10-CM | POA: Diagnosis not present

## 2022-12-05 DIAGNOSIS — Z131 Encounter for screening for diabetes mellitus: Secondary | ICD-10-CM | POA: Diagnosis not present

## 2022-12-05 LAB — OB RESULTS CONSOLE GC/CHLAMYDIA
Chlamydia: NEGATIVE
Neisseria Gonorrhea: NEGATIVE

## 2022-12-05 LAB — HEPATITIS C ANTIBODY: HCV Ab: NEGATIVE

## 2022-12-05 LAB — OB RESULTS CONSOLE HEPATITIS B SURFACE ANTIGEN: Hepatitis B Surface Ag: NEGATIVE

## 2022-12-05 LAB — OB RESULTS CONSOLE VARICELLA ZOSTER ANTIBODY, IGG: Varicella: IMMUNE

## 2022-12-05 LAB — OB RESULTS CONSOLE RUBELLA ANTIBODY, IGM: Rubella: IMMUNE

## 2022-12-05 LAB — OB RESULTS CONSOLE RPR: RPR: NONREACTIVE

## 2022-12-05 LAB — OB RESULTS CONSOLE HIV ANTIBODY (ROUTINE TESTING): HIV: NONREACTIVE

## 2023-01-03 DIAGNOSIS — Z3482 Encounter for supervision of other normal pregnancy, second trimester: Secondary | ICD-10-CM | POA: Diagnosis not present

## 2023-01-14 DIAGNOSIS — Z23 Encounter for immunization: Secondary | ICD-10-CM | POA: Diagnosis not present

## 2023-01-31 DIAGNOSIS — Z3482 Encounter for supervision of other normal pregnancy, second trimester: Secondary | ICD-10-CM | POA: Diagnosis not present

## 2023-03-26 DIAGNOSIS — Z3482 Encounter for supervision of other normal pregnancy, second trimester: Secondary | ICD-10-CM | POA: Diagnosis not present

## 2023-03-26 LAB — OB RESULTS CONSOLE RPR: RPR: NONREACTIVE

## 2023-03-28 DIAGNOSIS — R7309 Other abnormal glucose: Secondary | ICD-10-CM | POA: Diagnosis not present

## 2023-04-09 NOTE — L&D Delivery Note (Signed)
 Delivery Note At 5:30 AM a viable female was delivered via Vaginal, Spontaneous (Presentation: Left Occiput Anterior).  APGAR:  9, 9; weight pending.   Placenta status: Spontaneous, Intact.  Cord: 3 vessels with the following complications: None.  Cord pH: n/a  Anesthesia: Epidural Episiotomy: None Lacerations: Vaginal;Perineal;2nd degree Suture Repair: 2.0 vicryl Est. Blood Loss (mL): 500  Mom to postpartum.  Baby to Couplet care / Skin to Skin.  Called to see patient.  Mom pushed to deliver a viable female infant.  The head followed by shoulders, which delivered without difficulty, and the rest of the body.  A single, tight nuchal cord noted and delivered through.  Baby to mom's chest.  Cord clamped and cut after > 1 min delay.  No cord blood obtained.  Placenta delivered spontaneously, intact, with a 3-vessel cord.  Second degree perineal laceration repaired with 2-0 Vicryl in standard fashion.  All counts correct.  Hemostasis obtained with IV pitocin and fundal massage. EBL 500 mL.     Female chaperone present for delivery  Thomasene Mohair, MD 06/28/2023, 5:57 AM

## 2023-04-18 DIAGNOSIS — Z23 Encounter for immunization: Secondary | ICD-10-CM | POA: Diagnosis not present

## 2023-05-15 DIAGNOSIS — Z23 Encounter for immunization: Secondary | ICD-10-CM | POA: Diagnosis not present

## 2023-05-15 DIAGNOSIS — Z2911 Encounter for prophylactic immunotherapy for respiratory syncytial virus (RSV): Secondary | ICD-10-CM | POA: Diagnosis not present

## 2023-05-15 DIAGNOSIS — Z3A33 33 weeks gestation of pregnancy: Secondary | ICD-10-CM | POA: Diagnosis not present

## 2023-06-06 DIAGNOSIS — O09523 Supervision of elderly multigravida, third trimester: Secondary | ICD-10-CM | POA: Diagnosis not present

## 2023-06-06 DIAGNOSIS — Z3403 Encounter for supervision of normal first pregnancy, third trimester: Secondary | ICD-10-CM | POA: Diagnosis not present

## 2023-06-06 LAB — OB RESULTS CONSOLE HIV ANTIBODY (ROUTINE TESTING): HIV: NONREACTIVE

## 2023-06-06 LAB — OB RESULTS CONSOLE GC/CHLAMYDIA
Chlamydia: NEGATIVE
Neisseria Gonorrhea: NEGATIVE

## 2023-06-26 DIAGNOSIS — O09523 Supervision of elderly multigravida, third trimester: Secondary | ICD-10-CM | POA: Diagnosis not present

## 2023-06-26 DIAGNOSIS — R030 Elevated blood-pressure reading, without diagnosis of hypertension: Secondary | ICD-10-CM | POA: Diagnosis not present

## 2023-06-27 ENCOUNTER — Inpatient Hospital Stay
Admission: RE | Admit: 2023-06-27 | Discharge: 2023-06-30 | DRG: 807 | Disposition: A | Discharge status: Home / Self Care | Attending: Obstetrics and Gynecology | Admitting: Obstetrics and Gynecology

## 2023-06-27 ENCOUNTER — Other Ambulatory Visit: Payer: Self-pay

## 2023-06-27 ENCOUNTER — Inpatient Hospital Stay: Admitting: Anesthesiology

## 2023-06-27 ENCOUNTER — Encounter: Payer: Self-pay | Admitting: Obstetrics and Gynecology

## 2023-06-27 DIAGNOSIS — Z23 Encounter for immunization: Secondary | ICD-10-CM | POA: Diagnosis not present

## 2023-06-27 DIAGNOSIS — Q2112 Patent foramen ovale: Secondary | ICD-10-CM | POA: Diagnosis not present

## 2023-06-27 DIAGNOSIS — Z3A40 40 weeks gestation of pregnancy: Secondary | ICD-10-CM | POA: Diagnosis not present

## 2023-06-27 DIAGNOSIS — R200 Anesthesia of skin: Secondary | ICD-10-CM | POA: Diagnosis not present

## 2023-06-27 DIAGNOSIS — O09513 Supervision of elderly primigravida, third trimester: Secondary | ICD-10-CM | POA: Diagnosis not present

## 2023-06-27 DIAGNOSIS — O09519 Supervision of elderly primigravida, unspecified trimester: Secondary | ICD-10-CM

## 2023-06-27 DIAGNOSIS — R7981 Abnormal blood-gas level: Secondary | ICD-10-CM | POA: Diagnosis not present

## 2023-06-27 DIAGNOSIS — Z3490 Encounter for supervision of normal pregnancy, unspecified, unspecified trimester: Principal | ICD-10-CM | POA: Diagnosis present

## 2023-06-27 DIAGNOSIS — O480 Post-term pregnancy: Secondary | ICD-10-CM | POA: Diagnosis not present

## 2023-06-27 DIAGNOSIS — O26893 Other specified pregnancy related conditions, third trimester: Secondary | ICD-10-CM | POA: Diagnosis not present

## 2023-06-27 DIAGNOSIS — Q250 Patent ductus arteriosus: Secondary | ICD-10-CM | POA: Diagnosis not present

## 2023-06-27 DIAGNOSIS — O0990 Supervision of high risk pregnancy, unspecified, unspecified trimester: Secondary | ICD-10-CM

## 2023-06-27 HISTORY — DX: Other specified health status: Z78.9

## 2023-06-27 LAB — CBC
HCT: 38.4 % (ref 36.0–46.0)
Hemoglobin: 13.2 g/dL (ref 12.0–15.0)
MCH: 29.4 pg (ref 26.0–34.0)
MCHC: 34.4 g/dL (ref 30.0–36.0)
MCV: 85.5 fL (ref 80.0–100.0)
Platelets: 261 10*3/uL (ref 150–400)
RBC: 4.49 MIL/uL (ref 3.87–5.11)
RDW: 13.3 % (ref 11.5–15.5)
WBC: 6.9 10*3/uL (ref 4.0–10.5)
nRBC: 0 % (ref 0.0–0.2)

## 2023-06-27 LAB — TYPE AND SCREEN
ABO/RH(D): B POS
Antibody Screen: NEGATIVE

## 2023-06-27 LAB — COMPREHENSIVE METABOLIC PANEL
ALT: 21 U/L (ref 0–44)
AST: 24 U/L (ref 15–41)
Albumin: 3.1 g/dL — ABNORMAL LOW (ref 3.5–5.0)
Alkaline Phosphatase: 140 U/L — ABNORMAL HIGH (ref 38–126)
Anion gap: 11 (ref 5–15)
BUN: 12 mg/dL (ref 6–20)
CO2: 19 mmol/L — ABNORMAL LOW (ref 22–32)
Calcium: 9.2 mg/dL (ref 8.9–10.3)
Chloride: 104 mmol/L (ref 98–111)
Creatinine, Ser: 0.73 mg/dL (ref 0.44–1.00)
GFR, Estimated: >60 mL/min (ref >60)
Glucose, Bld: 87 mg/dL (ref 70–99)
Potassium: 3.9 mmol/L (ref 3.5–5.1)
Sodium: 134 mmol/L — ABNORMAL LOW (ref 135–145)
Total Bilirubin: 0.6 mg/dL (ref 0.0–1.2)
Total Protein: 5.8 g/dL — ABNORMAL LOW (ref 6.5–8.1)

## 2023-06-27 LAB — ABO/RH: ABO/RH(D): B POS

## 2023-06-27 LAB — RPR: RPR Ser Ql: NONREACTIVE

## 2023-06-27 LAB — PROTEIN / CREATININE RATIO, URINE
Creatinine, Urine: 112 mg/dL
Protein Creatinine Ratio: 0.09 mg/mg{creat} (ref 0.00–0.15)
Total Protein, Urine: 10 mg/dL

## 2023-06-27 MED ORDER — LIDOCAINE HCL (PF) 1 % IJ SOLN
INTRAMUSCULAR | Status: DC | PRN
  Administered 2023-06-27 – 2023-06-28 (×2): 3 mL via SUBCUTANEOUS

## 2023-06-27 MED ORDER — BUPIVACAINE HCL (PF) 0.25 % IJ SOLN
INTRAMUSCULAR | Status: DC | PRN
Start: 2023-06-27 — End: 2023-06-28
  Administered 2023-06-27 (×2): 4 mL via EPIDURAL
  Administered 2023-06-28 (×2): 5 mL via EPIDURAL

## 2023-06-27 MED ORDER — LACTATED RINGERS IV SOLN: INTRAVENOUS | Status: DC

## 2023-06-27 MED ORDER — SOD CITRATE-CITRIC ACID 500-334 MG/5ML PO SOLN: 30.0000 mL | ORAL | Status: DC | PRN

## 2023-06-27 MED ORDER — LIDOCAINE HCL (PF) 1 % IJ SOLN
30.0000 mL | INTRAMUSCULAR | Status: DC | PRN
  Filled 2023-06-27: qty 30

## 2023-06-27 MED ORDER — ACETAMINOPHEN 325 MG PO TABS
650.0000 mg | ORAL_TABLET | ORAL | Status: DC | PRN
  Filled 2023-06-27: qty 2

## 2023-06-27 MED ORDER — ONDANSETRON HCL 4 MG/2ML IJ SOLN
4.0000 mg | Freq: Four times a day (QID) | INTRAMUSCULAR | Status: DC | PRN
  Administered 2023-06-28: 4 mg via INTRAVENOUS
  Filled 2023-06-27: qty 2

## 2023-06-27 MED ORDER — PHENYLEPHRINE 80 MCG/ML (10ML) SYRINGE FOR IV PUSH (FOR BLOOD PRESSURE SUPPORT): 80.0000 ug | PREFILLED_SYRINGE | INTRAVENOUS | Status: DC | PRN

## 2023-06-27 MED ORDER — EPHEDRINE 5 MG/ML INJ: 10.0000 mg | INTRAVENOUS | Status: DC | PRN

## 2023-06-27 MED ORDER — OXYCODONE-ACETAMINOPHEN 5-325 MG PO TABS: 1.0000 | ORAL_TABLET | ORAL | Status: DC | PRN

## 2023-06-27 MED ORDER — MISOPROSTOL 25 MCG QUARTER TABLET
25.0000 ug | ORAL_TABLET | ORAL | Status: AC
  Administered 2023-06-27 (×2): 25 ug via VAGINAL
  Filled 2023-06-27 (×2): qty 1

## 2023-06-27 MED ORDER — AMMONIA AROMATIC IN INHA
RESPIRATORY_TRACT | Status: AC
  Filled 2023-06-27: qty 10

## 2023-06-27 MED ORDER — OXYCODONE-ACETAMINOPHEN 5-325 MG PO TABS: 2.0000 | ORAL_TABLET | ORAL | Status: DC | PRN

## 2023-06-27 MED ORDER — FENTANYL CITRATE (PF) 100 MCG/2ML IJ SOLN: 50.0000 ug | INTRAMUSCULAR | Status: DC | PRN

## 2023-06-27 MED ORDER — MISOPROSTOL 200 MCG PO TABS
ORAL_TABLET | ORAL | Status: AC
  Filled 2023-06-27: qty 4

## 2023-06-27 MED ORDER — OXYTOCIN 10 UNIT/ML IJ SOLN
INTRAMUSCULAR | Status: AC
  Filled 2023-06-27: qty 2

## 2023-06-27 MED ORDER — OXYTOCIN BOLUS FROM INFUSION
333.0000 mL | Freq: Once | INTRAVENOUS | Status: AC
  Administered 2023-06-28: 333 mL via INTRAVENOUS

## 2023-06-27 MED ORDER — OXYTOCIN-SODIUM CHLORIDE 30-0.9 UT/500ML-% IV SOLN
2.5000 [IU]/h | INTRAVENOUS | Status: DC
  Administered 2023-06-28: 2.5 [IU]/h via INTRAVENOUS
  Filled 2023-06-27: qty 500

## 2023-06-27 MED ORDER — LACTATED RINGERS IV SOLN
500.0000 mL | Freq: Once | INTRAVENOUS | Status: AC
  Administered 2023-06-27: 500 mL via INTRAVENOUS

## 2023-06-27 MED ORDER — OXYTOCIN-SODIUM CHLORIDE 30-0.9 UT/500ML-% IV SOLN
1.0000 m[IU]/min | INTRAVENOUS | Status: DC
  Administered 2023-06-28: 2 m[IU]/min via INTRAVENOUS

## 2023-06-27 MED ORDER — LIDOCAINE-EPINEPHRINE (PF) 1.5 %-1:200000 IJ SOLN
INTRAMUSCULAR | Status: DC | PRN
  Administered 2023-06-27 – 2023-06-28 (×2): 3 mL via EPIDURAL

## 2023-06-27 MED ORDER — MISOPROSTOL 25 MCG QUARTER TABLET
25.0000 ug | ORAL_TABLET | ORAL | Status: AC
  Administered 2023-06-27: 25 ug via ORAL
  Filled 2023-06-27 (×2): qty 1

## 2023-06-27 MED ORDER — DIPHENHYDRAMINE HCL 50 MG/ML IJ SOLN: 12.5000 mg | INTRAMUSCULAR | Status: DC | PRN

## 2023-06-27 MED ORDER — FENTANYL-BUPIVACAINE-NACL 0.5-0.125-0.9 MG/250ML-% EP SOLN
12.0000 mL/h | EPIDURAL | Status: DC | PRN
  Administered 2023-06-27: 12 mL/h via EPIDURAL

## 2023-06-27 MED ORDER — TERBUTALINE SULFATE 1 MG/ML IJ SOLN: 0.2500 mg | Freq: Once | INTRAMUSCULAR | Status: DC | PRN

## 2023-06-27 MED ORDER — FENTANYL-BUPIVACAINE-NACL 0.5-0.125-0.9 MG/250ML-% EP SOLN
EPIDURAL | Status: AC
  Filled 2023-06-27: qty 250

## 2023-06-27 MED ORDER — LACTATED RINGERS IV SOLN
500.0000 mL | INTRAVENOUS | Status: DC | PRN
Start: 2023-06-27 — End: 2023-06-28

## 2023-06-27 NOTE — H&P (Signed)
 OB History & Physical   History of Present Illness:  Chief Complaint:   HPI:  Joy Lawson is a 38 y.o. G1P0 female at [redacted]w[redacted]d dated by LMP.  She presents to L&D for induction for AMA.  She reports:  -active fetal movement -no leakage of fluid -bloody show started with UCs last night  -onset of contractions starting last night   Pregnancy Issues: 1. AMA   Maternal Medical History:   Past Medical History:  Diagnosis Date   Medical history non-contributory     Past Surgical History:  Procedure Laterality Date   FOOT TENDON SURGERY      No Known Allergies  Prior to Admission medications   Not on File     Prenatal care site: [facility]   Social History: She  reports that she has never smoked. She has never used smokeless tobacco. She reports that she does not drink alcohol and does not use drugs.  Family History: family history is not on file.   Review of Systems: A full review of systems was performed and negative except as noted in the HPI.    Physical Exam:  Vital Signs: BP 128/85 (BP Location: Left Arm)   Temp 98.1 F (36.7 C) (Oral)   Resp 18   Ht 5' 10.47" (1.79 m)   Wt 95.7 kg   LMP 09/20/2022 (Exact Date)   BMI 29.87 kg/m   General:   alert and cooperative  Skin:  normal  Neurologic:    Alert & oriented x 3  Lungs:    Nl effort  Heart:   regular rate and rhythm  Abdomen:  soft, non-tender; bowel sounds normal; no masses,  no organomegaly  Extremities: : non-tender, symmetric, no edema bilaterally.      EFW:  3,465  g       86%     7 lb 10 oz   Results for orders placed or performed during the hospital encounter of 06/27/23 (from the past 24 hours)  CBC     Status: None   Collection Time: 06/27/23  8:39 AM  Result Value Ref Range   WBC 6.9 4.0 - 10.5 K/uL   RBC 4.49 3.87 - 5.11 MIL/uL   Hemoglobin 13.2 12.0 - 15.0 g/dL   HCT 91.4 78.2 - 95.6 %   MCV 85.5 80.0 - 100.0 fL   MCH 29.4 26.0 - 34.0 pg   MCHC 34.4 30.0 - 36.0 g/dL    RDW 21.3 08.6 - 57.8 %   Platelets 261 150 - 400 K/uL   nRBC 0.0 0.0 - 0.2 %  Type and screen [facility]     Status: None   Collection Time: 06/27/23  8:39 AM  Result Value Ref Range   ABO/RH(D) B POS    Antibody Screen NEG    Sample Expiration      06/30/2023,2359 Performed at [facility], [address], [place], [place] [address]     Pertinent Results:  Prenatal Labs: Blood type/Rh B pos  Antibody screen neg  Rubella Immune  Varicella Immune  RPR NR  HBsAg Neg  HIV NR  GC neg  Chlamydia neg  Genetic screening negative  1 hour GTT 145  3 hour GTT 79 147 114 46   GBS neg   FHT: FHR: 130 bpm, variability: moderate,  accelerations:  Present,  decelerations:  Absent Category/reactivity:  Category I TOCO: occasional  SVE:   /   /  Assessment:  Joy Lawson is a 38 y.o. G1P0 female at [redacted]w[redacted]d with AMA.   Plan:  1. Admit to Labor & Delivery; consents reviewed and obtained  2. Fetal Well being  - Fetal Tracing: Cat I - GBS neg - Presentation: vtx confirmed by sve   3. Routine OB: - Prenatal labs reviewed, as above - Rh pos - CBC & T&S on admit - Clear fluids, IVF  4. Induction of Labor -  Contractions by external toco in place -  Plan for induction with Cytotec -  Plan for continuous fetal monitoring  -  Maternal pain control as desired: IVPM, nitrous, regional anesthesia - Anticipate vaginal delivery  5. Post Partum Planning: - Infant feeding: Breastfeeding - Contraception: none - Tdap: given 04/18/23  - Flu: got 01/14/23 in [place]  - RSV: given 05/15/23   [name], CNM 06/27/2023 10:35 AM

## 2023-06-27 NOTE — Anesthesia Preprocedure Evaluation (Signed)
 Anesthesia Evaluation  Patient identified by MRN, date of birth, ID band Patient awake    Reviewed: Allergy & Precautions, NPO status , Patient's Chart, lab work & pertinent test results  History of Anesthesia Complications Negative for: history of anesthetic complications  Airway Mallampati: III  TM Distance: <3 FB Neck ROM: full    Dental  (+) Chipped   Pulmonary neg pulmonary ROS   Pulmonary exam normal        Cardiovascular Exercise Tolerance: Good (-) hypertensionnegative cardio ROS Normal cardiovascular exam     Neuro/Psych    GI/Hepatic negative GI ROS,,,  Endo/Other    Renal/GU   negative genitourinary   Musculoskeletal   Abdominal   Peds  Hematology negative hematology ROS (+)   Anesthesia Other Findings Past Medical History: No date: Medical history non-contributory  Past Surgical History: No date: FOOT TENDON SURGERY  BMI    Body Mass Index: 29.87 kg/m      Reproductive/Obstetrics (+) Pregnancy                             Anesthesia Physical Anesthesia Plan  ASA: 2  Anesthesia Plan: Epidural   Post-op Pain Management:    Induction:   PONV Risk Score and Plan:   Airway Management Planned: Natural Airway  Additional Equipment:   Intra-op Plan:   Post-operative Plan:   Informed Consent: I have reviewed the patients History and Physical, chart, labs and discussed the procedure including the risks, benefits and alternatives for the proposed anesthesia with the patient or authorized representative who has indicated his/her understanding and acceptance.     Dental Advisory Given  Plan Discussed with: Anesthesiologist  Anesthesia Plan Comments: (Patient reports no bleeding problems and no anticoagulant use.   Patient consented for risks of anesthesia including but not limited to:  - adverse reactions to medications - risk of bleeding, infection and or  nerve damage from epidural that could lead to paralysis - risk of headache or failed epidural - nerve damage due to positioning - that if epidural is used for C-section that there is a chance of epidural failure requiring spinal placement or conversion to GA - Damage to heart, brain, lungs, other parts of body or loss of life  Patient voiced understanding and assent.)       Anesthesia Quick Evaluation

## 2023-06-27 NOTE — Progress Notes (Signed)
 Reviewed patient's course to this point.  Currently very uncomfortable with contractions. Awaiting anesthesia for epidural.  Fetal tracing occasionally cat 2 with minimal variability and then variability improves.  ? Decelerations, appear to be early vs maybe variables (though tracing is broken during contractions, so I'm unable to give definite assessment).  Blood pressures did become more elevated after admission.  Pre-eclampsia labs normal. No signs/symptoms of severe features.    Continue current plan of care.   Estimate that fetal weight will be in low 4,000 gram range, given last growth ultrasound in February (see H&P).  Joy Mohair, MD, [facility] 06/27/2023 9:06 PM

## 2023-06-27 NOTE — Anesthesia Procedure Notes (Signed)
 Epidural Patient location during procedure: OB Start time: 06/27/2023 9:31 PM End time: 06/27/2023 9:34 PM  Staffing Anesthesiologist: Santi Troung, Cleda Mccreedy, MD Performed: anesthesiologist   Preanesthetic Checklist Completed: patient identified, IV checked, site marked, risks and benefits discussed, surgical consent, monitors and equipment checked, pre-op evaluation and timeout performed  Epidural Patient position: sitting Prep: ChloraPrep Patient monitoring: heart rate, continuous pulse ox and blood pressure Approach: midline Location: L3-L4 Injection technique: LOR saline  Needle:  Needle type: Tuohy  Needle gauge: 17 G Needle length: 9 cm and 9 Needle insertion depth: 6 cm Catheter type: closed end flexible Catheter size: 19 Gauge Catheter at skin depth: 11 cm Test dose: negative and 1.5% lidocaine with Epi 1:200 K  Assessment Sensory level: T10 Events: blood not aspirated, no cerebrospinal fluid, injection not painful, no injection resistance, no paresthesia and negative IV test  Additional Notes 1 attempt Pt. Evaluated and documentation done after procedure finished. Patient identified. Risks/Benefits/Options discussed with patient including but not limited to bleeding, infection, nerve damage, paralysis, failed block, incomplete pain control, headache, blood pressure changes, nausea, vomiting, reactions to medication both or allergic, itching and postpartum back pain. Confirmed with bedside nurse the patient's most recent platelet count. Confirmed with patient that they are not currently taking any anticoagulation, have any bleeding history or any family history of bleeding disorders. Patient expressed understanding and wished to proceed. All questions were answered. Sterile technique was used throughout the entire procedure. Please see nursing notes for vital signs. Test dose was given through epidural catheter and negative prior to continuing to dose epidural or start  infusion. Warning signs of high block given to the patient including shortness of breath, tingling/numbness in hands, complete motor block, or any concerning symptoms with instructions to call for help. Patient was given instructions on fall risk and not to get out of bed. All questions and concerns addressed with instructions to call with any issues or inadequate analgesia.    Patient tolerated the insertion well without immediate complications.Reason for block:procedure for pain

## 2023-06-28 ENCOUNTER — Encounter: Payer: Self-pay | Admitting: Obstetrics and Gynecology

## 2023-06-28 DIAGNOSIS — O0990 Supervision of high risk pregnancy, unspecified, unspecified trimester: Secondary | ICD-10-CM

## 2023-06-28 DIAGNOSIS — O09519 Supervision of elderly primigravida, unspecified trimester: Secondary | ICD-10-CM

## 2023-06-28 DIAGNOSIS — Z3A40 40 weeks gestation of pregnancy: Secondary | ICD-10-CM

## 2023-06-28 MED ORDER — IBUPROFEN 600 MG PO TABS
ORAL_TABLET | ORAL | Status: AC
  Filled 2023-06-28: qty 1

## 2023-06-28 MED ORDER — BENZOCAINE-MENTHOL 20-0.5 % EX AERO
INHALATION_SPRAY | CUTANEOUS | Status: AC
  Administered 2023-06-28: 1 via TOPICAL
  Filled 2023-06-28: qty 56

## 2023-06-28 MED ORDER — SENNOSIDES-DOCUSATE SODIUM 8.6-50 MG PO TABS: 2.0000 | ORAL_TABLET | ORAL | Status: DC

## 2023-06-28 MED ORDER — BENZOCAINE-MENTHOL 20-0.5 % EX AERO: 1.0000 | INHALATION_SPRAY | CUTANEOUS | Status: DC | PRN

## 2023-06-28 MED ORDER — FERROUS SULFATE 325 (65 FE) MG PO TABS
325.0000 mg | ORAL_TABLET | Freq: Two times a day (BID) | ORAL | Status: DC
  Filled 2023-06-28: qty 1

## 2023-06-28 MED ORDER — SIMETHICONE 80 MG PO CHEW: 80.0000 mg | CHEWABLE_TABLET | ORAL | Status: DC | PRN

## 2023-06-28 MED ORDER — IBUPROFEN 600 MG PO TABS
600.0000 mg | ORAL_TABLET | Freq: Four times a day (QID) | ORAL | Status: DC
  Administered 2023-06-28 (×2): 600 mg via ORAL
  Filled 2023-06-28: qty 1

## 2023-06-28 MED ORDER — DIBUCAINE (PERIANAL) 1 % EX OINT
TOPICAL_OINTMENT | CUTANEOUS | Status: AC
  Administered 2023-06-28: 1 via RECTAL
  Filled 2023-06-28: qty 28

## 2023-06-28 MED ORDER — ONDANSETRON HCL 4 MG PO TABS: 4.0000 mg | ORAL_TABLET | ORAL | Status: DC | PRN

## 2023-06-28 MED ORDER — COCONUT OIL OIL: 1.0000 | TOPICAL_OIL | Status: DC | PRN

## 2023-06-28 MED ORDER — WITCH HAZEL-GLYCERIN EX PADS: 1.0000 | MEDICATED_PAD | CUTANEOUS | Status: DC | PRN

## 2023-06-28 MED ORDER — COCONUT OIL OIL
TOPICAL_OIL | Status: AC
  Administered 2023-06-28: 1 via TOPICAL
  Filled 2023-06-28: qty 7.5

## 2023-06-28 MED ORDER — WITCH HAZEL-GLYCERIN EX PADS
MEDICATED_PAD | CUTANEOUS | Status: AC
  Administered 2023-06-28: 1 via TOPICAL
  Filled 2023-06-28: qty 100

## 2023-06-28 MED ORDER — DIBUCAINE (PERIANAL) 1 % EX OINT: 1.0000 | TOPICAL_OINTMENT | CUTANEOUS | Status: DC | PRN

## 2023-06-28 MED ORDER — ACETAMINOPHEN 325 MG PO TABS
650.0000 mg | ORAL_TABLET | ORAL | Status: DC | PRN
  Administered 2023-06-28 (×2): 650 mg via ORAL
  Filled 2023-06-28: qty 2

## 2023-06-28 MED ORDER — ONDANSETRON HCL 4 MG/2ML IJ SOLN: 4.0000 mg | INTRAMUSCULAR | Status: DC | PRN

## 2023-06-28 MED ORDER — DIPHENHYDRAMINE HCL 25 MG PO CAPS: 25.0000 mg | ORAL_CAPSULE | Freq: Four times a day (QID) | ORAL | Status: DC | PRN

## 2023-06-28 MED ORDER — PRENATAL MULTIVITAMIN CH: 1.0000 | ORAL_TABLET | Freq: Every day | ORAL | Status: DC

## 2023-06-28 NOTE — Lactation Note (Signed)
 This note was copied from a baby's chart. Lactation Consultation Note  Patient Name: Joy Lawson [id number] Date: 06/28/2023 Age:37 hours Reason for consult: Initial assessment;Primapara;Term   Maternal Data Has patient been taught Hand Expression?: Yes Does the patient have breastfeeding experience prior to this delivery?: No  Feeding Mother's Current Feeding Choice: Breast Milk LC rounds just as mom had ended feeding on right breast in football hold, had already fed 20 min on left and approx 5 on right, as baby moved, started rooting and crying, encouraged mom to offer right breast again so I could assess latch, moved to cradle hold on right, mom shown how to hand express drops before offering breast and shown pic in breastfeeding handout and also shown how get deep latch as baby wanted to latch to tip of nipple, by positioning  hand to shape breast and supporting the breast, baby sleepy so feeding attempts ended.          LATCH Score Latch: Repeated attempts needed to sustain latch, nipple held in mouth throughout feeding, stimulation needed to elicit sucking reflex.                  Lactation Tools Discussed/Used    Interventions Interventions: Breast feeding basics reviewed;Assisted with latch;Hand express;Skin to skin;Support pillows;Position options;Education LC name and no written on white board, pictures on latching and hand expression reviewed in Postpartum handbook Discharge Pump: Personal WIC Program: No  Consult Status Consult Status: PRN    Dyann Kief 06/28/2023, 11:24 AM

## 2023-06-28 NOTE — Anesthesia Procedure Notes (Signed)
 Epidural Patient location during procedure: OB Start time: 06/28/2023 1:56 AM End time: 06/28/2023 1:59 AM  Staffing Anesthesiologist: Jacquelyn Shadrick, Cleda Mccreedy, MD Performed: anesthesiologist   Preanesthetic Checklist Completed: patient identified, IV checked, site marked, risks and benefits discussed, surgical consent, monitors and equipment checked, pre-op evaluation and timeout performed  Epidural Patient position: sitting Prep: ChloraPrep Patient monitoring: heart rate, continuous pulse ox and blood pressure Approach: midline Location: L2-L3 Injection technique: LOR saline  Needle:  Needle type: Tuohy  Needle gauge: 17 G Needle length: 9 cm and 9 Needle insertion depth: 6 cm Catheter type: closed end flexible Catheter size: 19 Gauge Catheter at skin depth: 12 cm Test dose: negative and 1.5% lidocaine with Epi 1:200 K  Assessment Sensory level: T10 Events: blood not aspirated, no cerebrospinal fluid, injection not painful, no injection resistance, no paresthesia and negative IV test  Additional Notes 1 attempt Pt. Evaluated and documentation done after procedure finished. Patient identified. Risks/Benefits/Options discussed with patient including but not limited to bleeding, infection, nerve damage, paralysis, failed block, incomplete pain control, headache, blood pressure changes, nausea, vomiting, reactions to medication both or allergic, itching and postpartum back pain. Confirmed with bedside nurse the patient's most recent platelet count. Confirmed with patient that they are not currently taking any anticoagulation, have any bleeding history or any family history of bleeding disorders. Patient expressed understanding and wished to proceed. All questions were answered. Sterile technique was used throughout the entire procedure. Please see nursing notes for vital signs. Test dose was given through epidural catheter and negative prior to continuing to dose epidural or start  infusion. Warning signs of high block given to the patient including shortness of breath, tingling/numbness in hands, complete motor block, or any concerning symptoms with instructions to call for help. Patient was given instructions on fall risk and not to get out of bed. All questions and concerns addressed with instructions to call with any issues or inadequate analgesia.    Patient tolerated the insertion well without immediate complications.Reason for block:procedure for pain

## 2023-06-28 NOTE — Progress Notes (Signed)
 Labor Check  Subj:  Complaints: comfortable with epidural   Obj:  BP (!) 122/96   Pulse 63   Temp 98 F (36.7 C) (Oral)   Resp 18   Ht 5' 10.47" (1.79 m)   Wt 95.7 kg   LMP 09/20/2022 (Exact Date)   BMI 29.87 kg/m     Cervix: Dilation: 10 / Effacement (%): 100 / Station: Plus 1   AROM: mild-moderate meconium Baseline FHR: 135 beats/min   Variability: moderate   Accelerations: present   Decelerations: present (not currently, but variables earlier) Contractions: present frequency: 3-4 q 10 min Overall assessment: cat 1 currently  Female chaperone present for pelvic exam:   A/P: 38 y.o. G1P0 female at [redacted]w[redacted]d with IOL for AMA.  1.  Labor: continue expectant management. She is has received no medication since her last dose of misoprostoll   2.  FWB: reassuring, Overall assessment: category 1  3.  GBS neg  4.  Pain: epidural 5.  Recheck: prn. Start pushing Discussed meconium and its implications. Will monitor after delivery.   Thomasene Mohair, MD, [facility] 06/28/2023 12:21 AM

## 2023-06-28 NOTE — Progress Notes (Signed)
 I was called to see patient for concern of weakness and numbness in leg. Pt states her RLE strength has improved and does not have any weakness now. She reports numbness in her Right medial thigh. Physical exam with 5/5 strength in R ankle dorsiflexion/plantar flexion, knee flexion/extension, hip flexion/extension. She was encouraged that a epidural hematoma was highly unlikely and her symptoms are indicative of a peripheral nerve injury from labor; likely obturator nerve. She understood and would like to be seen tomorrow morning.  Nelta Numbers MD Ermalinda Memos

## 2023-06-28 NOTE — Discharge Summary (Signed)
 Postpartum Discharge Summary  Patient Name: Joy Lawson DOB: Aug 17, 1985 MRN: [id number]  Date of admission: 06/27/2023 Delivery date:06/28/2023 Delivering provider: Thomasene Mohair D Date of discharge: 06/29/2023  Primary OB: [facility] [id number] last menstrual period was 09/20/2022 (exact date). EDC Estimated Date of Delivery: 06/27/23 Gestational Age at Delivery: [redacted]w[redacted]d   Admitting diagnosis: Encounter for induction of labor [Z34.90] Intrauterine pregnancy: [redacted]w[redacted]d     Secondary diagnosis:   Principal Problem:   Normal vaginal delivery Active Problems:   Encounter for induction of labor   Supervision of high risk pregnancy, antepartum   Advanced maternal age, 1st pregnancy   [redacted] weeks gestation of pregnancy   Discharge Diagnosis: Term Pregnancy Delivered      Hospital course: Induction of Labor With Vaginal Delivery   38 y.o. yo G1P1001 at [redacted]w[redacted]d was admitted to the hospital 06/27/2023 for induction of labor.  Indication for induction: AMA.  Patient had an labor course complicated by none Membrane Rupture Time/Date: 12:17 AM,06/28/2023  Delivery Method:Vaginal, Spontaneous Operative Delivery:N/A Episiotomy: None Lacerations:  Vaginal;Perineal;2nd degree Details of delivery can be found in separate delivery note.  Patient had a postpartum course complicated by none. Patient is discharged home 06/29/23.  Newborn Data: Birth date:06/28/2023 Birth time:5:30 AM Gender:Female "Joy Lawson" Living status:Living Apgars:9 ,9  Weight:3780 g                                            Post partum procedures: none Induction:: AROM, Pitocin, and Cytotec Complications: None Delivery Type: spontaneous vaginal delivery Anesthesia: epidural anesthesia Placenta: spontaneous To Pathology: No   Prenatal Labs:  Blood type/Rh B pos  Antibody screen neg  Rubella Immune  Varicella Immune  RPR NR  HBsAg Neg  HIV NR  GC neg  Chlamydia neg  Genetic screening negative  1 hour GTT  145  3 hour GTT 79 147 114 46   GBS neg    Magnesium Sulfate received: No BMZ received: No Rhophylac:was not indicated MMR: was not indicated Varivax vaccine given: was not indicated - Tdap vaccine: Given prenatally 04/18/2023 - Flu vaccine: Given prenatally 01/14/2023 -RSV vaccine:RSV Given: Given during pregnancy >/=14 days ago  Transfusion:No  Physical exam  Vitals:   06/28/23 1545 06/28/23 1948 06/29/23 0011 06/29/23 0759  BP: 120/72 123/82 125/76 109/77  Pulse: 83 86 88 76  Resp: 18 18 18 18   Temp: 97.6 F (36.4 C) 98.1 F (36.7 C) 98.7 F (37.1 C) 98 F (36.7 C)  TempSrc: Oral Oral Oral   SpO2: 99% 99% 98% 97%  Weight:      Height:       General: alert, cooperative, and no distress Lochia: appropriate Uterine Fundus: firm Perineum:minimal edema/repair well approximated Incision: n/a DVT Evaluation: No evidence of DVT seen on physical exam.  Labs: Lab Results  Component Value Date   WBC 11.8 (H) 06/29/2023   HGB 11.0 (L) 06/29/2023   HCT 32.9 (L) 06/29/2023   MCV 87.7 06/29/2023   PLT 209 06/29/2023      Latest Ref Rng & Units 06/27/2023    6:34 PM  CMP  Glucose 70 - 99 mg/dL 87   BUN 6 - 20 mg/dL 12   Creatinine 5.36 - 1.00 mg/dL 6.44   Sodium 034 - 742 mmol/L 134   Potassium 3.5 - 5.1 mmol/L 3.9   Chloride 98 - 111 mmol/L 104   CO2 22 -  32 mmol/L 19   Calcium 8.9 - 10.3 mg/dL 9.2   Total Protein 6.5 - 8.1 g/dL 5.8   Total Bilirubin 0.0 - 1.2 mg/dL 0.6   Alkaline Phos 38 - 126 U/L 140   AST 15 - 41 U/L 24   ALT 0 - 44 U/L 21    Edinburgh Score:    06/28/2023    5:15 PM  Edinburgh Postnatal Depression Scale Screening Tool  I have been able to laugh and see the funny side of things. 0  I have looked forward with enjoyment to things. 0  I have blamed myself unnecessarily when things went wrong. 0  I have been anxious or worried for no good reason. 0  I have felt scared or panicky for no good reason. 0  Things have been getting on top of me. 0   I have been so unhappy that I have had difficulty sleeping. 0  I have felt sad or miserable. 0  I have been so unhappy that I have been crying. 0  The thought of harming myself has occurred to me. 0  Edinburgh Postnatal Depression Scale Total 0    Risk assessment for postpartum VTE and prophylactic treatment: Very high risk factors: If any risk factors: 6 weeks LMHW and None High risk factors: If 1 risk factor, mechanical prophylaxis and early ambulation , If > 1 risk factor OR 1 risk factor + 1 moderate risk factor: 3-6 weeks of LMWH, and None Moderate risk factors: If 3 or more risk factors: mechanical prophylaxis and early ambulation OR 3-6 weeks of LMWH and None  Postpartum VTE prophylaxis with LMWH not indicated  After visit meds:  Allergies as of 06/29/2023   No Known Allergies      Medication List     TAKE these medications    cholecalciferol 25 MCG (1000 UNIT) tablet Commonly known as: VITAMIN D3 Take 1,000 Units by mouth daily.   prenatal multivitamin Tabs tablet Take 1 tablet by mouth daily at 12 noon.       Discharge home in stable condition Infant Feeding: Breast Infant Disposition:home with mother Discharge instruction: per After Visit Summary and Postpartum booklet. Activity: Advance as tolerated. Pelvic rest for 6 weeks.  Diet: routine diet Anticipated Birth Control:  Contraceptives: None Postpartum Appointment:6 weeks Additional Postpartum F/U:  none or as indicated Future Appointments:No future appointments. Follow up Visit:  Follow-up Information     Conard Novak, MD. Schedule an appointment as soon as possible for a visit in 6 week(s).   Specialty: Obstetrics and Gynecology Why: Six weeks postpartum visit Contact information: [address] [place] [address] [phone]                 Plan:  [name] was discharged to home in good condition. Follow-up appointment as directed.    SignedCyril Mourning 06/29/2023 10:39 AM

## 2023-06-28 NOTE — Progress Notes (Signed)
 Labor Progress Note  Joy Lawson is a 38 y.o. G1P0 at [redacted]w[redacted]d by LMP admitted for induction of labor due to [facility].  Subjective: Pt is pushing  Objective: BP 112/67   Pulse 68   Temp 98 F (36.7 C) (Oral)   Resp 18   Ht 5' 10.47" (1.79 m)   Wt 95.7 kg   LMP 09/20/2022 (Exact Date)   SpO2 99%   BMI 29.87 kg/m   Fetal Assessment: FHT:  FHR: 120 bpm, variability: moderate,  accelerations:  Present,  decelerations:  Present earlies Category/reactivity:  Category I and Category II UC:   regular, every 2-3 minutes SVE:    Dilation: 10cm  Effacement: 100%  Station:  +1  Consistency: soft  Position: anterior  Membrane status: AROM at 0017 Amniotic color: Light mec  Labs: Lab Results  Component Value Date   WBC 6.9 06/27/2023   HGB 13.2 06/27/2023   HCT 38.4 06/27/2023   MCV 85.5 06/27/2023   PLT 261 06/27/2023    Assessment / Plan: Induction of labor due to AMA 0030 Started pushing 0115 Stopped pushing  0159 Epidural replaced 0240 Restarted pushing  Total pushing 1:30  Labor: Progressing normally Preeclampsia:   112/67 Fetal Wellbeing:  Category I and Category II Pain Control:  Epidural I/D:   Afebrile, GBS neg, AROM x3hrs Anticipated MOD:  NSVD  Cyril Mourning, CNM 06/28/2023, 2:55 AM

## 2023-06-29 LAB — CBC
HCT: 32.9 % — ABNORMAL LOW (ref 36.0–46.0)
Hemoglobin: 11 g/dL — ABNORMAL LOW (ref 12.0–15.0)
MCH: 29.3 pg (ref 26.0–34.0)
MCHC: 33.4 g/dL (ref 30.0–36.0)
MCV: 87.7 fL (ref 80.0–100.0)
Platelets: 209 10*3/uL (ref 150–400)
RBC: 3.75 MIL/uL — ABNORMAL LOW (ref 3.87–5.11)
RDW: 13.7 % (ref 11.5–15.5)
WBC: 11.8 10*3/uL — ABNORMAL HIGH (ref 4.0–10.5)
nRBC: 0 % (ref 0.0–0.2)

## 2023-06-29 NOTE — Anesthesia Postprocedure Evaluation (Signed)
 Anesthesia Post Note  Patient: Joy Lawson  Procedure(s) Performed: AN AD HOC LABOR EPIDURAL  Patient location during evaluation: Mother Baby Anesthesia Type: Epidural Level of consciousness: awake and alert Pain management: pain level controlled Vital Signs Assessment: post-procedure vital signs reviewed and stable Respiratory status: spontaneous breathing, nonlabored ventilation and respiratory function stable Cardiovascular status: stable Postop Assessment: no headache, no backache and epidural receding Anesthetic complications: no Comments: Patient states that her numbness has totally resolved. No complaints, questions  No notable events documented.   Last Vitals:  Vitals:   06/29/23 0011 06/29/23 0759  BP: 125/76 109/77  Pulse: 88 76  Resp: 18 18  Temp: 37.1 C 36.7 C  SpO2: 98% 97%    Last Pain:  Vitals:   06/29/23 0530  TempSrc:   PainSc: 0-No pain                 Stephanie Coup

## 2023-06-29 NOTE — Lactation Note (Signed)
 This note was copied from a baby's chart. Lactation Consultation Note  Patient Name: Joy Lawson [id number] Date: 06/29/2023 Age:37 hours Reason for consult: Follow-up assessment;Primapara;Term   Maternal Data This is mom's 1st baby, SVD. Mom with AMA. On follow-up visit mom reports baby continues to breastfeed well with plenty of wet and stool diapers. Mom reports normative nipple tenderness. Mom recognizes when baby at times has a shallow latch. Mom is able to get baby on deeper. Mom with questions about when to collect milk in anticipation of returning to work and when dad can give an occasional bottle. Mom has Dr.Brown's bottles. Has patient been taught Hand Expression?: Yes Does the patient have breastfeeding experience prior to this delivery?: No  Feeding Mother's Current Feeding Choice: Breast Milk   Interventions Interventions: Breast feeding basics reviewed;Education  Discharge Discharge Education: Engorgement and breast care;Warning signs for feeding baby;Outpatient recommendation Pump: Personal [facility] Doy Mince)  Consult Status Consult Status: Complete  Update provided to care nurse.  Fuller Song 06/29/2023, 11:25 AM

## 2023-06-29 NOTE — Anesthesia Post-op Follow-up Note (Signed)
  Anesthesia Pain Follow-up Note  Patient: Joy Lawson  Day #: 1  Date of Follow-up: 06/29/2023 Time: 8:45 AM  Last Vitals:  Vitals:   06/29/23 0011 06/29/23 0759  BP: 125/76 109/77  Pulse: 88 76  Resp: 18 18  Temp: 37.1 C 36.7 C  SpO2: 98% 97%    Level of Consciousness: alert  Pain: none   Side Effects:None  Catheter Site Exam:clean, dry, no drainage     Plan: D/C from anesthesia care at surgeon's request  Stephanie Coup

## 2023-06-30 NOTE — Progress Notes (Signed)
 Patient discharged home with family. Discharge instructions, when to follow up, and prescriptions reviewed with patient. Patient verbalized understanding. Patient will be escorted out by auxiliary.

## 2023-06-30 NOTE — Discharge Summary (Signed)
 Postpartum Discharge Summary  Patient Name: Joy Lawson DOB: Apr 07, 1986 MRN: [id number]  Date of admission: 06/27/2023 Delivery date:06/28/2023 Delivering provider: Thomasene Mohair D Date of discharge: 06/30/2023  Primary OB: [facility] [id number] last menstrual period was 09/20/2022 (exact date). EDC Estimated Date of Delivery: 06/27/23 Gestational Age at Delivery: [redacted]w[redacted]d   Admitting diagnosis: Encounter for induction of labor [Z34.90] Intrauterine pregnancy: [redacted]w[redacted]d     Secondary diagnosis:   Principal Problem:   Normal vaginal delivery Active Problems:   Encounter for induction of labor   Supervision of high risk pregnancy, antepartum   Advanced maternal age, 1st pregnancy   [redacted] weeks gestation of pregnancy   Discharge Diagnosis: Term Pregnancy Delivered      Hospital course: Induction of Labor With Vaginal Delivery   38 y.o. yo G1P1001 at [redacted]w[redacted]d was admitted to the hospital 06/27/2023 for induction of labor.  Indication for induction: AMA.  Patient had an labor course complicated by none Membrane Rupture Time/Date: 12:17 AM,06/28/2023  Delivery Method:Vaginal, Spontaneous Operative Delivery:N/A Episiotomy: None Lacerations:  Vaginal;Perineal;2nd degree Details of delivery can be found in separate delivery note.  Patient had a postpartum course complicated by none. Patient is discharged home 06/30/23.  Newborn Data: Birth date:06/28/2023 Birth time:5:30 AM Gender:Female "Joy Lawson" Living status:Living Apgars:9 ,9  Weight:3780 g                                            Post partum procedures: none Induction:: AROM, Pitocin, and Cytotec Complications: None Delivery Type: spontaneous vaginal delivery Anesthesia: epidural anesthesia Placenta: spontaneous To Pathology: No   Prenatal Labs:  Blood type/Rh B pos  Antibody screen neg  Rubella Immune  Varicella Immune  RPR NR  HBsAg Neg  HIV NR  GC neg  Chlamydia neg  Genetic screening negative  1 hour GTT  145  3 hour GTT 79 147 114 46   GBS neg    Magnesium Sulfate received: No BMZ received: No Rhophylac:was not indicated MMR: was not indicated Varivax vaccine given: was not indicated - Tdap vaccine: Given prenatally 04/18/2023 - Flu vaccine: Given prenatally 01/14/2023 -RSV vaccine:RSV Given: Given during pregnancy >/=14 days ago  Transfusion:No  Physical exam  Vitals:   06/29/23 0759 06/29/23 1550 06/29/23 2323 06/30/23 0741  BP: 109/77 120/82 (!) 138/90 (!) 122/90  Pulse:    66  Resp: 18 18 18 18   Temp: 98 F (36.7 C) 98 F (36.7 C) 98.7 F (37.1 C) 97.7 F (36.5 C)  TempSrc:   Oral Oral  SpO2:    100%  Weight:      Height:       General: alert, cooperative, and no distress Lochia: appropriate Uterine Fundus: firm Perineum:minimal edema/repair well approximated Incision: n/a DVT Evaluation: No evidence of DVT seen on physical exam.  Labs: Lab Results  Component Value Date   WBC 11.8 (H) 06/29/2023   HGB 11.0 (L) 06/29/2023   HCT 32.9 (L) 06/29/2023   MCV 87.7 06/29/2023   PLT 209 06/29/2023      Latest Ref Rng & Units 06/27/2023    6:34 PM  CMP  Glucose 70 - 99 mg/dL 87   BUN 6 - 20 mg/dL 12   Creatinine 6.44 - 1.00 mg/dL 0.34   Sodium 742 - 595 mmol/L 134   Potassium 3.5 - 5.1 mmol/L 3.9   Chloride 98 - 111 mmol/L 104  CO2 22 - 32 mmol/L 19   Calcium 8.9 - 10.3 mg/dL 9.2   Total Protein 6.5 - 8.1 g/dL 5.8   Total Bilirubin 0.0 - 1.2 mg/dL 0.6   Alkaline Phos 38 - 126 U/L 140   AST 15 - 41 U/L 24   ALT 0 - 44 U/L 21    Edinburgh Score:    06/28/2023    5:15 PM  Edinburgh Postnatal Depression Scale Screening Tool  I have been able to laugh and see the funny side of things. 0  I have looked forward with enjoyment to things. 0  I have blamed myself unnecessarily when things went wrong. 0  I have been anxious or worried for no good reason. 0  I have felt scared or panicky for no good reason. 0  Things have been getting on top of me. 0  I have  been so unhappy that I have had difficulty sleeping. 0  I have felt sad or miserable. 0  I have been so unhappy that I have been crying. 0  The thought of harming myself has occurred to me. 0  Edinburgh Postnatal Depression Scale Total 0    Risk assessment for postpartum VTE and prophylactic treatment: Very high risk factors: If any risk factors: 6 weeks LMHW and None High risk factors: If 1 risk factor, mechanical prophylaxis and early ambulation , If > 1 risk factor OR 1 risk factor + 1 moderate risk factor: 3-6 weeks of LMWH, and None Moderate risk factors: If 3 or more risk factors: mechanical prophylaxis and early ambulation OR 3-6 weeks of LMWH and None  Postpartum VTE prophylaxis with LMWH not indicated  After visit meds:  Allergies as of 06/30/2023   No Known Allergies      Medication List     TAKE these medications    cholecalciferol 25 MCG (1000 UNIT) tablet Commonly known as: VITAMIN D3 Take 1,000 Units by mouth daily.   prenatal multivitamin Tabs tablet Take 1 tablet by mouth daily at 12 noon.       Discharge home in stable condition Infant Feeding: Breast Infant Disposition:home with mother Discharge instruction: per After Visit Summary and Postpartum booklet. Activity: Advance as tolerated. Pelvic rest for 6 weeks.  Diet: routine diet Anticipated Birth Control:  Contraceptives: None Postpartum Appointment:6 weeks Additional Postpartum F/U:  none or as indicated Future Appointments:No future appointments. Follow up Visit:  Follow-up Information     Conard Novak, MD. Schedule an appointment as soon as possible for a visit in 6 week(s).   Specialty: Obstetrics and Gynecology Why: Six weeks postpartum visit Contact information: [address] [place] [address] [phone]         [facility] Follow up in 3 day(s).   Why: BP check Contact information: [address] [place]  [address] [phone]                Plan:  Joy Lawson was discharged to home in good condition. Follow-up appointment as directed.    Signed: Janyce Llanos 06/30/2023 9:55 AM

## 2023-06-30 NOTE — Lactation Note (Signed)
 This note was copied from a baby's chart. Lactation Consultation Note  Patient Name: Girl Verna Desrocher [id number] Date: 06/30/2023 Age:37 hours Reason for consult: Follow-up assessment;Primapara;Term   Maternal Data Follow up assessment w/ a P1 patient and a 52 hr baby girl.  Parents stated that feedings were going well but they had a couple of questions in regards to arousing infant for a feeding.  Feeding Mother's Current Feeding Choice: Breast Milk  Interventions Interventions: Breast feeding basics reviewed;Education LC reviewed infant arousal techniques with parents and discussed logging all feedings and wet/dirty diapers.   Discharge Discharge Education: Engorgement and breast care;Outpatient recommendation Education on engorgement prevention/treatment was discussed as well as breastmilk storage guidelines.  LC provided patient with a handout on breastmilk storage guidelines from [facility]. [facility] outpatient lactation services phone number written on the white board in the room.  Patient verbalized understanding.  LC also provided education from the postpartum book about warning signs to look for in a poor feeding.    Consult Status Consult Status: Complete    Yvette Rack Bethel Gaglio 06/30/2023, 11:14 AM

## 2023-08-06 DIAGNOSIS — Z1332 Encounter for screening for maternal depression: Secondary | ICD-10-CM | POA: Diagnosis not present

## 2023-09-17 ENCOUNTER — Ambulatory Visit: Payer: Self-pay | Admitting: *Deleted

## 2023-09-17 NOTE — Telephone Encounter (Signed)
 FYI Only or Action Required?: FYI only for provider  Patient was last seen in primary care on 11/19/2022 by Joy Becton, FNP. Called Nurse Triage reporting Urticaria. Symptoms began today. Interventions attempted: Other: increased water. Symptoms are: gradually improving.  Triage Disposition: See Physician Within 24 Hours  Patient/caregiver understands and will follow disposition?: Call to CAL- same day appointment offered- patient declined- UC advised  Reason for Disposition  [1] MODERATE-SEVERE hives persist (i.e., hives interfere with normal activities or work) AND [2] taking antihistamine (e.g., Benadryl , Claritin) > 24 hours    Patient not taking antihistamine due to breastfeeding, patient states redness disappearing- unsure cause- no history on allergy in past  Answer Assessment - Initial Assessment Questions Patient states it is not emergent and her symptoms are improving- advised UC- not knowing what she reacted to next reaction could be more severe. She states she is aware and will be seen if she should have another reaction.  1. APPEARANCE: What does the rash look like?      Red patches 2. LOCATION: Where is the rash located?      Legs,arms, face, neck 3. NUMBER: How many hives are there?      multiple 4. SIZE: How big are the hives? (inches, cm, compare to coins) Do they all look the same or is there lots of variation in shape and size?      Face entirely red, other 2-3 inches 5. ONSET: When did the hives begin? (Hours or days ago)      Started 10- 15 minutes ago 6. ITCHING: Does it itch? If Yes, ask: How bad is the itch?    - MILD: doesn't interfere with normal activities   - MODERATE-SEVERE: interferes with work, school, sleep, or other activities      No-hot to touch 7. RECURRENT PROBLEM: Have you had hives before? If Yes, ask: When was the last time? and What happened that time?      no 8. TRIGGERS: Were you exposed to any new food, plant, cosmetic  product or animal just before the hives began?     no 9. OTHER SYMPTOMS: Do you have any other symptoms? (e.g., fever, tongue swelling, difficulty breathing, abdomen pain)     no 10. PREGNANCY: Is there any chance you are pregnant? When was your last menstrual period?       Post partum- patient is breastfeeding  Protocols used: Hives-A-AH    Copied from CRM [id number]. Topic: Clinical - Red Word Triage >> Sep 17, 2023 11:24 AM Joy Lawson wrote: Red Word that prompted transfer to Nurse Triage: patient calling in allergic reaction red spots, in patches all over body, red and hot to the touch. Face is swollen a little

## 2023-09-19 ENCOUNTER — Ambulatory Visit: Admitting: Physician Assistant

## 2023-11-25 ENCOUNTER — Ambulatory Visit: Payer: Self-pay

## 2023-11-25 NOTE — Telephone Encounter (Signed)
 Can offer acute appt with any provider. Needs new PCP since Kelly left too

## 2023-11-25 NOTE — Telephone Encounter (Signed)
 nursing a 39 month old child vomited a couple of time in the middle of night, diarrhea,   patient is nursing a 70 month old child, vomited a couple of time in the middle of night, diarrhea, able to keep  water down, slowing eating dry toast,  headache with pressure around temples     FYI Only or Action Required?: FYI only for provider.  Patient was last seen in primary care on 11/19/2022 by Emilio Kelly DASEN, FNP.  Called Nurse Triage reporting Vomiting.  Symptoms began yesterday.  Interventions attempted: OTC medications: Pepto Bismol.  Symptoms are: gradually worsening.  Triage Disposition: See Physician Within 24 Hours  Patient/caregiver understands and will follow disposition?: Yes Reason for Disposition  [1] MODERATE diarrhea (e.g., 4-6 times / day more than normal) AND [2] present > 48 hours (2 days)  Answer Assessment - Initial Assessment Questions 1. DIARRHEA SEVERITY: How bad is the diarrhea? How many more stools have you had in the past 24 hours than normal?      4-5 episode  2. ONSET: When did the diarrhea begin?      Last night  3. STOOL DESCRIPTION:  How loose or watery is the diarrhea? What is the stool color? Is there any blood or mucous in the stool?     Black in color, took Pepto Bismol  4. VOMITING: Are you also vomiting? If Yes, ask: How many times in the past 24 hours?      Yes, 2x  5. ABDOMEN PAIN: Are you having any abdomen pain? If Yes, ask: What does it feel like? (e.g., crampy, dull, intermittent, constant)      No abd pain  6. ABDOMEN PAIN SEVERITY: If present, ask: How bad is the pain?  (e.g., Scale 1-10; mild, moderate, or severe)     No  7. ORAL INTAKE: If vomiting, Have you been able to drink liquids? How much liquids have you had in the past 24 hours?     Able to keep water down  8. HYDRATION: Any signs of dehydration? (e.g., dry mouth [not just dry lips], too weak to stand, dizziness, new weight loss) When did you last  urinate?     Feels weak and dizzy  9. EXPOSURE: Have you traveled to a foreign country recently? Have you been exposed to anyone with diarrhea? Could you have eaten any food that was spoiled?     Husband also sick with same symptoms. Both had spaghetti with salad  10. ANTIBIOTIC USE: Are you taking antibiotics now or have you taken antibiotics in the past 2 months?       no 11. OTHER SYMPTOMS: Do you have any other symptoms? (e.g., fever, blood in stool)       No  12. PREGNANCY: Is there any chance you are pregnant? When was your last menstrual period?       No  Protocols used: Diarrhea-A-AH

## 2023-11-27 NOTE — Telephone Encounter (Signed)
 Noted

## 2024-01-29 ENCOUNTER — Telehealth: Payer: Self-pay

## 2024-01-29 NOTE — Telephone Encounter (Signed)
 Copied from CRM [id number]. Topic: Appointments - Transfer of Care >> Jan 29, 2024  9:00 AM [name] wrote: Pt is requesting to transfer FROM: Joy Lawson Pt is requesting to transfer TO: allison carter Reason for requested transfer: pt request It is the responsibility of the team the patient would like to transfer to (Dr. franchot) to reach out to the patient if for any reason this transfer is not acceptable.

## 2024-03-22 NOTE — Progress Notes (Unsigned)
 Complete physical exam   Patient: Joy Lawson   DOB: October 01, 1985   38 y.o. Female  MRN: [id number] Visit Date: 03/23/2024  Today's healthcare provider: Isaiah DELENA Pepper, MD   Chief Complaint  Patient presents with   Transfer of Care    TOC/Est care/CPE?   Subjective    Joy Lawson is a 38 y.o. female who presents today for a complete physical exam.  She reports consuming a general diet. Home exercise routine includes walking, running, weight training. She generally feels well. She reports sleeping well. She does not have additional problems to discuss today.    Discussed the use of AI scribe software for clinical note transcription with the patient, who gave verbal consent to proceed.  History of Present Illness Joy Lawson is a 38 year old female who presents for an annual physical exam.  She has no new health concerns since her last visit in 2024. She has had a baby since her last visit and is no longer breastfeeding. She is not taking prenatal vitamins.  She is not currently taking any medications but does take vitamin D , magnesium, zinc, and occasionally a seaweed supplement like kelp. She is up to date with her flu and COVID vaccinations.  No family history of breast or colon cancer. She meal preps and avoids eating out, but does not follow any special diet. She engages in various forms of exercise including weightlifting, running, jogging, biking, and using the elliptical. She reports good sleep quality.  No vision issues and she sees a dentist regularly, with her last visit being in July or August. 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  She does not smoke or consume alcohol.    Last depression screening scores    03/23/2024    1:09 PM 11/19/2022    1:42 PM 11/14/2021   10:43 AM  PHQ 2/9 Scores  PHQ - 2 Score 0 0 0  PHQ- 9 Score 0  0      Data saved with a previous flowsheet row definition    Last fall risk screening    11/19/2022    1:42 PM  Fall Risk   Falls in the past year? 0  Injury with Fall? 0   Risk for fall due to : No Fall Risks  Follow up Falls evaluation completed     Data saved with a previous flowsheet row definition    Dental: Last dental visit: July   Medications: Show/hide medication list[1]  Review of Systems as noted in HPI.     Objective    BP 111/68 (BP Location: Left Arm, Patient Position: Sitting, Cuff Size: Normal)   Pulse 76   Resp 16   Ht 5' 9 (1.753 m)   Wt 165 lb 1.6 oz (74.9 kg)   SpO2 100%   BMI 24.38 kg/m    Physical Exam Constitutional:      Appearance: Normal appearance.  HENT:     Head: Normocephalic and atraumatic.     Mouth/Throat:     Mouth: Mucous membranes are moist.  Eyes:     Pupils: Pupils are equal, round, and reactive to light.  Cardiovascular:     Rate and Rhythm: Normal rate and regular rhythm.     Heart sounds: Normal heart sounds.  Pulmonary:     Effort: Pulmonary effort is normal.     Breath sounds: Normal breath sounds.  Skin:  General: Skin is warm.  Neurological:     General: No focal deficit present.     Mental Status: She is alert.      No results found for any visits on 03/23/24.  Assessment & Plan    Routine Health Maintenance and Physical Exam  Immunization History  Administered Date(s) Administered   Influenza-Unspecified 01/08/2024   Pfizer(Comirnaty)Fall Seasonal Vaccine 12 years and older 01/08/2024   Tdap 07/16/2019    Health Maintenance  Topic Date Due   HPV VACCINES (1 - 3-dose SCDM series) 03/23/2025 (Originally 05/18/2013)   Cervical Cancer Screening (HPV/Pap Cotest)  11/15/2026   DTaP/Tdap/Td (2 - Td or Tdap) 07/15/2029   Influenza Vaccine  Completed   COVID-19 Vaccine  Completed   Hepatitis C Screening  Completed   HIV Screening  Completed   Pneumococcal Vaccine  Aged Out   Meningococcal B Vaccine  Aged Out   Hepatitis B Vaccines 19-59 Average Risk   Discontinued    Discussed health benefits of physical activity, and encouraged her to engage in regular exercise appropriate for her age and condition.  Problem List Items Addressed This Visit       Other   Annual physical exam - Primary   Other Visit Diagnoses       Screening for diabetes mellitus (DM)       Relevant Orders   Hemoglobin A1c     Screening for cardiovascular condition       Relevant Orders   Lipid panel     Low TSH level       Relevant Orders   TSH + free T4      Assessment & Plan Annual Exam Routine annual exam with no new concerns. Excellent blood pressure. Does not take any medications. Up to date on vaccinations. - Discussed eating a balanced diet and incorporating movement into daily routine.  - Ordered routine blood work: cholesterol, blood sugar. - Scheduled next annual physical in one year. - Start mammogram screenings at age 10  History of low TSH Last TSH of 0.329 during pregnancy. Asymptomatic. Will recheck TSH today to ensure stability. - TSH + free T4 ordered   Return in about 1 year (around 03/23/2025) for Annual Physical Exam.     Isaiah DELENA Pepper, MD  [facility] [phone] (phone) [phone] (fax)     [1]  Outpatient Medications Prior to Visit  Medication Sig   cholecalciferol (VITAMIN D3) 25 MCG (1000 UNIT) tablet Take 1,000 Units by mouth daily.   Prenatal Vit-Fe Fumarate-FA (PRENATAL MULTIVITAMIN) TABS tablet Take 1 tablet by mouth daily at 12 noon.   No facility-administered medications prior to visit.

## 2024-03-23 ENCOUNTER — Ambulatory Visit (INDEPENDENT_AMBULATORY_CARE_PROVIDER_SITE_OTHER)

## 2024-03-23 VITALS — BP 111/68 | HR 76 | Resp 16 | Ht 69.0 in | Wt 165.1 lb

## 2024-03-23 DIAGNOSIS — Z131 Encounter for screening for diabetes mellitus: Secondary | ICD-10-CM | POA: Diagnosis not present

## 2024-03-23 DIAGNOSIS — Z0000 Encounter for general adult medical examination without abnormal findings: Secondary | ICD-10-CM | POA: Diagnosis not present

## 2024-03-23 DIAGNOSIS — Z136 Encounter for screening for cardiovascular disorders: Secondary | ICD-10-CM | POA: Diagnosis not present

## 2024-03-23 DIAGNOSIS — R7989 Other specified abnormal findings of blood chemistry: Secondary | ICD-10-CM

## 2024-03-24 LAB — TSH+FREE T4
Free T4: 1.31 ng/dL (ref 0.82–1.77)
TSH: 0.4 u[IU]/mL — ABNORMAL LOW (ref 0.450–4.500)

## 2024-03-24 LAB — LIPID PANEL
Chol/HDL Ratio: 2.6 ratio (ref 0.0–4.4)
Cholesterol, Total: 164 mg/dL (ref 100–199)
HDL: 62 mg/dL (ref >39)
LDL Chol Calc (NIH): 92 mg/dL (ref 0–99)
Triglycerides: 46 mg/dL (ref 0–149)
VLDL Cholesterol Cal: 10 mg/dL (ref 5–40)

## 2024-03-24 LAB — HEMOGLOBIN A1C
Est. average glucose Bld gHb Est-mCnc: 105 mg/dL
Hgb A1c MFr Bld: 5.3 % (ref 4.8–5.6)

## 2024-03-30 ENCOUNTER — Ambulatory Visit: Payer: Self-pay

## 2025-03-25 ENCOUNTER — Encounter
